# Patient Record
Sex: Female | Born: 1970
Health system: Southern US, Community
[De-identification: ages and names within clinical notes are randomized; demographics above are authoritative.]

## PROBLEM LIST (undated history)

## (undated) DIAGNOSIS — D571 Sickle-cell disease without crisis: Secondary | ICD-10-CM

## (undated) DIAGNOSIS — T7840XA Allergy, unspecified, initial encounter: Secondary | ICD-10-CM

## (undated) DIAGNOSIS — E785 Hyperlipidemia, unspecified: Secondary | ICD-10-CM

## (undated) DIAGNOSIS — I10 Essential (primary) hypertension: Secondary | ICD-10-CM

## (undated) HISTORY — DX: Sickle-cell disease without crisis: D57.1

## (undated) HISTORY — DX: Hyperlipidemia, unspecified: E78.5

## (undated) HISTORY — DX: Allergy, unspecified, initial encounter: T78.40XA

---

## 2010-02-08 ENCOUNTER — Inpatient Hospital Stay (HOSPITAL_COMMUNITY)
Admission: AD | Admit: 2010-02-08 | Discharge: 2010-02-08 | Payer: Self-pay | Source: Home / Self Care | Attending: Obstetrics & Gynecology | Admitting: Obstetrics & Gynecology

## 2010-02-08 ENCOUNTER — Inpatient Hospital Stay (HOSPITAL_COMMUNITY)
Admission: AD | Admit: 2010-02-08 | Discharge: 2010-02-08 | Disposition: A | Discharge status: Still In Hospital | Payer: Self-pay | Source: Home / Self Care | Admitting: Obstetrics & Gynecology

## 2010-02-22 ENCOUNTER — Encounter: Payer: Self-pay | Admitting: Physician Assistant

## 2010-02-23 ENCOUNTER — Inpatient Hospital Stay (HOSPITAL_COMMUNITY)
Admission: AD | Admit: 2010-02-23 | Discharge: 2010-02-23 | Disposition: A | Discharge status: Home / Self Care | Payer: Self-pay | Source: Ambulatory Visit | Attending: Obstetrics and Gynecology | Admitting: Obstetrics and Gynecology

## 2010-02-23 DIAGNOSIS — I10 Essential (primary) hypertension: Secondary | ICD-10-CM

## 2010-02-23 DIAGNOSIS — N751 Abscess of Bartholin's gland: Secondary | ICD-10-CM

## 2010-02-26 LAB — POCT PREGNANCY, URINE: Preg Test, Ur: NEGATIVE

## 2010-03-01 ENCOUNTER — Encounter: Payer: Self-pay | Admitting: Family Medicine

## 2010-09-26 ENCOUNTER — Other Ambulatory Visit: Payer: Self-pay | Admitting: Family Medicine

## 2010-09-26 ENCOUNTER — Other Ambulatory Visit: Payer: Self-pay | Admitting: Obstetrics & Gynecology

## 2010-09-26 DIAGNOSIS — Z1231 Encounter for screening mammogram for malignant neoplasm of breast: Secondary | ICD-10-CM

## 2010-10-10 ENCOUNTER — Ambulatory Visit (HOSPITAL_COMMUNITY)
Admission: RE | Admit: 2010-10-10 | Discharge: 2010-10-10 | Disposition: A | Discharge status: Home / Self Care | Payer: No Typology Code available for payment source | Source: Ambulatory Visit | Attending: Obstetrics & Gynecology | Admitting: Obstetrics & Gynecology

## 2010-10-10 DIAGNOSIS — Z1231 Encounter for screening mammogram for malignant neoplasm of breast: Secondary | ICD-10-CM

## 2010-10-19 ENCOUNTER — Emergency Department (HOSPITAL_COMMUNITY): Payer: No Typology Code available for payment source

## 2010-10-19 ENCOUNTER — Emergency Department (HOSPITAL_COMMUNITY)
Admission: EM | Admit: 2010-10-19 | Discharge: 2010-10-19 | Disposition: A | Discharge status: Home / Self Care | Payer: No Typology Code available for payment source | Attending: Emergency Medicine | Admitting: Emergency Medicine

## 2010-10-19 DIAGNOSIS — Y92410 Unspecified street and highway as the place of occurrence of the external cause: Secondary | ICD-10-CM | POA: Insufficient documentation

## 2010-10-19 DIAGNOSIS — S7010XA Contusion of unspecified thigh, initial encounter: Secondary | ICD-10-CM | POA: Insufficient documentation

## 2010-10-19 DIAGNOSIS — I10 Essential (primary) hypertension: Secondary | ICD-10-CM | POA: Insufficient documentation

## 2010-10-19 DIAGNOSIS — M549 Dorsalgia, unspecified: Secondary | ICD-10-CM | POA: Insufficient documentation

## 2010-10-19 DIAGNOSIS — T1490XA Injury, unspecified, initial encounter: Secondary | ICD-10-CM | POA: Insufficient documentation

## 2010-10-19 DIAGNOSIS — M542 Cervicalgia: Secondary | ICD-10-CM | POA: Insufficient documentation

## 2011-12-04 ENCOUNTER — Other Ambulatory Visit (HOSPITAL_COMMUNITY): Payer: Self-pay | Admitting: Physician Assistant

## 2011-12-04 DIAGNOSIS — Z1231 Encounter for screening mammogram for malignant neoplasm of breast: Secondary | ICD-10-CM

## 2011-12-24 ENCOUNTER — Ambulatory Visit (HOSPITAL_COMMUNITY)
Admission: RE | Admit: 2011-12-24 | Discharge: 2011-12-24 | Disposition: A | Discharge status: Home / Self Care | Payer: Self-pay | Source: Ambulatory Visit | Attending: Physician Assistant | Admitting: Physician Assistant

## 2011-12-24 DIAGNOSIS — Z1231 Encounter for screening mammogram for malignant neoplasm of breast: Secondary | ICD-10-CM

## 2014-08-11 ENCOUNTER — Other Ambulatory Visit (HOSPITAL_COMMUNITY): Payer: Self-pay | Admitting: Nurse Practitioner

## 2014-08-11 DIAGNOSIS — Z1231 Encounter for screening mammogram for malignant neoplasm of breast: Secondary | ICD-10-CM

## 2014-08-17 ENCOUNTER — Ambulatory Visit (HOSPITAL_COMMUNITY)
Admission: RE | Admit: 2014-08-17 | Discharge: 2014-08-17 | Disposition: A | Discharge status: Home / Self Care | Payer: BLUE CROSS/BLUE SHIELD | Source: Ambulatory Visit | Attending: Nurse Practitioner | Admitting: Nurse Practitioner

## 2014-08-17 DIAGNOSIS — Z1231 Encounter for screening mammogram for malignant neoplasm of breast: Secondary | ICD-10-CM | POA: Diagnosis not present

## 2014-12-14 ENCOUNTER — Encounter (HOSPITAL_COMMUNITY): Payer: Self-pay | Admitting: Emergency Medicine

## 2014-12-14 ENCOUNTER — Emergency Department (HOSPITAL_COMMUNITY)
Admission: EM | Admit: 2014-12-14 | Discharge: 2014-12-14 | Disposition: A | Discharge status: Home / Self Care | Payer: BLUE CROSS/BLUE SHIELD | Attending: Emergency Medicine | Admitting: Emergency Medicine

## 2014-12-14 DIAGNOSIS — Z79899 Other long term (current) drug therapy: Secondary | ICD-10-CM | POA: Diagnosis not present

## 2014-12-14 DIAGNOSIS — Z87891 Personal history of nicotine dependence: Secondary | ICD-10-CM | POA: Diagnosis not present

## 2014-12-14 DIAGNOSIS — X58XXXA Exposure to other specified factors, initial encounter: Secondary | ICD-10-CM | POA: Diagnosis not present

## 2014-12-14 DIAGNOSIS — Y9289 Other specified places as the place of occurrence of the external cause: Secondary | ICD-10-CM | POA: Diagnosis not present

## 2014-12-14 DIAGNOSIS — Y9389 Activity, other specified: Secondary | ICD-10-CM | POA: Insufficient documentation

## 2014-12-14 DIAGNOSIS — T783XXA Angioneurotic edema, initial encounter: Secondary | ICD-10-CM

## 2014-12-14 DIAGNOSIS — I10 Essential (primary) hypertension: Secondary | ICD-10-CM | POA: Insufficient documentation

## 2014-12-14 DIAGNOSIS — Y998 Other external cause status: Secondary | ICD-10-CM | POA: Insufficient documentation

## 2014-12-14 HISTORY — DX: Essential (primary) hypertension: I10

## 2014-12-14 MED ORDER — METHYLPREDNISOLONE SODIUM SUCC 125 MG IJ SOLR: 125.0000 mg | Freq: Once | INTRAMUSCULAR | Status: DC

## 2014-12-14 MED ORDER — PREDNISONE 20 MG PO TABS
60.0000 mg | ORAL_TABLET | Freq: Once | ORAL | Status: AC
Start: 2014-12-14 — End: 2014-12-14
  Administered 2014-12-14: 60 mg via ORAL
  Filled 2014-12-14: qty 3

## 2014-12-14 MED ORDER — SODIUM CHLORIDE 0.9 % IV SOLN
10.0000 mg | Freq: Once | INTRAVENOUS | Status: AC
  Administered 2014-12-14: 10 mg via INTRAVENOUS
  Filled 2014-12-14: qty 1

## 2014-12-14 MED ORDER — HYDROCHLOROTHIAZIDE 50 MG PO TABS: 50.0000 mg | ORAL_TABLET | Freq: Every day | ORAL | Status: DC

## 2014-12-14 MED ORDER — DIPHENHYDRAMINE HCL 50 MG/ML IJ SOLN
25.0000 mg | Freq: Once | INTRAMUSCULAR | Status: AC
  Administered 2014-12-14: 25 mg via INTRAVENOUS
  Filled 2014-12-14: qty 1

## 2014-12-14 NOTE — ED Provider Notes (Signed)
CSN: [id number]     Arrival date & time 12/14/14  3244 History   First MD Initiated Contact with Patient 12/14/14 0703     Chief Complaint  Patient presents with  . Allergic Reaction   Sally Rivera is a 44 y.o. female with a history of hypertension who presents to the emergency department complaining of upper lip swelling since yesterday. Patient reports she is having some allergy symptoms and took a Claritin yesterday. She reports around 3 PM yesterday she noticed some upper lip swelling. She reports taking some Benadryl last night without relief. Patient reports she's been on lisinopril-hydrochlorothiazide for the past several years. She reports she's had no trouble with this medicine. She reports she has not taken her medicine today. Patient denies history of angioedema previously. The patient denies tongue swelling, throat swelling, trouble breathing, fevers, chills, shortness of breath, chest pain, coughing, wheezing, abdominal pain, nausea, vomiting or rashes.   (Consider location/radiation/quality/duration/timing/severity/associated sxs/prior Treatment) HPI  Past Medical History  Diagnosis Date  . Hypertension    History reviewed. No pertinent past surgical history. No family history on file. Social History  Substance Use Topics  . Smoking status: Former Smoker    Quit date: 12/13/2012  . Smokeless tobacco: None  . Alcohol Use: None   OB History    No data available     Review of Systems  Constitutional: Negative for fever and chills.  HENT: Positive for facial swelling. Negative for congestion, mouth sores, sore throat and trouble swallowing.   Eyes: Negative for visual disturbance.  Respiratory: Negative for cough, chest tightness, shortness of breath and wheezing.   Cardiovascular: Negative for chest pain.  Gastrointestinal: Negative for nausea, vomiting, abdominal pain and diarrhea.  Genitourinary: Negative for dysuria.  Musculoskeletal: Negative for back pain and  neck pain.  Skin: Negative for rash and wound.  Neurological: Negative for syncope, light-headedness and headaches.      Allergies  Review of patient's allergies indicates no known allergies.  Home Medications   Prior to Admission medications   Medication Sig Start Date End Date Taking? Authorizing Provider  Cholecalciferol (VITAMIN D PO) Take 1 tablet by mouth daily.   Yes Historical Provider, MD  cyclobenzaprine (FLEXERIL) 10 MG tablet Take 10 mg by mouth 3 (three) times daily as needed for muscle spasms.   Yes Historical Provider, MD  diphenhydrAMINE (BENADRYL) 12.5 MG/5ML liquid Take 9 mg by mouth 4 (four) times daily as needed for allergies.   Yes Historical Provider, MD  loratadine (CLARITIN) 10 MG tablet Take 10 mg by mouth once.   Yes Historical Provider, MD  Probiotic Product (PROBIOTIC PO) Take 1 tablet by mouth daily.   Yes Historical Provider, MD  hydrochlorothiazide (HYDRODIURIL) 50 MG tablet Take 1 tablet (50 mg total) by mouth daily. 12/14/14   Everlene Farrier, PA-C   BP 137/101 mmHg  Pulse 75  Temp(Src) 98 F (36.7 C) (Oral)  Resp 18  SpO2 97% Physical Exam  Constitutional: She is oriented to person, place, and time. She appears well-developed and well-nourished. No distress.  Nontoxic appearing.  HENT:  Head: Normocephalic and atraumatic.  Right Ear: External ear normal.  Left Ear: External ear normal.  Mouth/Throat: Oropharynx is clear and moist.  Angioedema noted to the patient's upper lip. No tongue swelling noted. No posterior oropharyngeal edema. Uvula is midline without edema. Handling her secretions without difficulty. Bilateral tympanic membranes are pearly-gray without erythema or loss of landmarks.   Eyes: Conjunctivae are normal. Pupils are equal, round,  and reactive to light. Right eye exhibits no discharge. Left eye exhibits no discharge.  Neck: Normal range of motion. Neck supple. No tracheal deviation present.  Cardiovascular: Normal rate, regular  rhythm, normal heart sounds and intact distal pulses.  Exam reveals no gallop and no friction rub.   No murmur heard. Pulmonary/Chest: Effort normal and breath sounds normal. No respiratory distress. She has no wheezes. She has no rales.  Lungs clear to auscultation bilaterally.  Abdominal: Soft. There is no tenderness. There is no guarding.  Musculoskeletal: She exhibits no edema or tenderness.  No lower extremity edema or tenderness.  Lymphadenopathy:    She has no cervical adenopathy.  Neurological: She is alert and oriented to person, place, and time. Coordination normal.  Skin: Skin is warm and dry. No rash noted. She is not diaphoretic. No erythema. No pallor.  No hives or rashes.  Psychiatric: She has a normal mood and affect. Her behavior is normal.  Nursing note and vitals reviewed.   ED Course  Procedures (including critical care time) Labs Review Labs Reviewed - No data to display  Imaging Review No results found. I have personally reviewed and evaluated these images and lab results as part of my medical decision-making.   EKG Interpretation None      Filed Vitals:   12/14/14 0845 12/14/14 0915 12/14/14 0930 12/14/14 1000  BP: 151/102  149/86 137/101  Pulse: 69 66 73 75  Temp:      TempSrc:      Resp:      SpO2: 100% 100% 100% 97%     MDM   Meds given in ED:  Medications  diphenhydrAMINE (BENADRYL) injection 25 mg (25 mg Intravenous Given 12/14/14 0817)  famotidine (PEPCID) 10 mg in sodium chloride 0.9 % 25 mL (0 mg Intravenous Stopped 12/14/14 0903)  predniSONE (DELTASONE) tablet 60 mg (60 mg Oral Given 12/14/14 0816)    New Prescriptions   HYDROCHLOROTHIAZIDE (HYDRODIURIL) 50 MG TABLET    Take 1 tablet (50 mg total) by mouth daily.    Final diagnoses:  Angioedema of lips, initial encounter   This s a 44 y.o. female with a history of hypertension who presents to the emergency department complaining of upper lip swelling since yesterday. Patient  reports she is having some allergy symptoms and took a Claritin yesterday. She reports around 3 PM yesterday she noticed some upper lip swelling. She reports taking some Benadryl last night without relief. Patient reports she's been on lisinopril-hydrochlorothiazide for the past several years. She reports she's had no trouble with this medicine. She reports she has not taken her medicine today. The patient denies tongue swelling, throat swelling, trouble breathing, trouble swallowing or shortness of breath. On exam the patient is afebrile nontoxic appearing. The patient has angioedema of her upper lip. This is localized. There is no evidence of tongue swelling. No evidence of posterior oropharyngeal edema. No drooling. Patient is handling secretions without difficulty. Her lungs are clear to auscultation bilaterally.  We'll provide the patient with prednisone, Pepcid and Benadryl and observe in the ED. At several re-evaluations the patient denies any tongue swelling or trouble breathing. A final reevaluation patient's angioedema appears to be slightly improved. I believe this is from her lisinopril. I advised to stop taking lisinopril. Will place her on hydrochlorothiazide 50 mg until she can follow-up with her primary care provider. I advised very strict return precautions including to return if she has tongue or throat swelling or trouble breathing. I advised the  patient to follow-up with their primary care provider this week. I advised the patient to return to the emergency department with new or worsening symptoms or new concerns. The patient verbalized understanding and agreement with plan.     This patient was discussed with Dr. Rosalia Hammersay who agrees with assessment and plan.     Everlene FarrierWilliam Issis Lindseth, PA-C 12/14/14 1048  Margarita Grizzleanielle Ray, MD 12/14/14 [phone]

## 2014-12-14 NOTE — Discharge Instructions (Signed)
I believe her angioedema today was caused by the medication lisinopril. Please start taking lisinopril. Please take Hydrocort Dyazide 50 mg once a day until you're able to follow-up with her primary care provider to have new blood pressure medicines.   Angioedema Angioedema is a sudden swelling of tissues, often of the skin. It can occur on the face or genitals or in the abdomen or other body parts. The swelling usually develops over a short period and gets better in 24 to 48 hours. It often begins during the night and is found when the person wakes up. The person may also get red, itchy patches of skin (hives). Angioedema can be dangerous if it involves swelling of the air passages.  Depending on the cause, episodes of angioedema may only happen once, come back in unpredictable patterns, or repeat for several years and then gradually fade away.  CAUSES  Angioedema can be caused by an allergic reaction to various triggers. It can also result from nonallergic causes, including reactions to drugs, immune system disorders, viral infections, or an abnormal gene that is passed to you from your parents (hereditary). For some people with angioedema, the cause is unknown.  Some things that can trigger angioedema include:   Foods.   Medicines, such as ACE inhibitors, ARBs, nonsteroidal anti-inflammatory agents, or estrogen.   Latex.   Animal saliva.   Insect stings.   Dyes used in X-rays.   Mild injury.   Dental work.  Surgery.  Stress.   Sudden changes in temperature.   Exercise. SIGNS AND SYMPTOMS   Swelling of the skin.  Hives. If these are present, there is also intense itching.  Redness in the affected area.   Pain in the affected area.  Swollen lips or tongue.  Breathing problems. This may happen if the air passages swell.  Wheezing. If internal organs are involved, there may be:   Nausea.   Abdominal pain.   Vomiting.   Difficulty swallowing.    Difficulty passing urine. DIAGNOSIS   Your health care provider will examine the affected area and take a medical and family history.  Various tests may be done to help determine the cause. Tests may include:  Allergy skin tests to see if the problem is an allergic reaction.   Blood tests to check for hereditary angioedema.   Tests to check for underlying diseases that could cause the condition.   A review of your medicines, including over-the-counter medicines, may be done. TREATMENT  Treatment will depend on the cause of the angioedema. Possible treatments include:   Removal of anything that triggered the condition (such as stopping certain medicines).   Medicines to treat symptoms or prevent attacks. Medicines given may include:   Antihistamines.   Epinephrine injection.   Steroids.   Hospitalization may be required for severe attacks. If the air passages are affected, it can be an emergency. Tubes may need to be placed to keep the airway open. HOME CARE INSTRUCTIONS   Take all medicines as directed by your health care provider.  If you were given medicines for emergency allergy treatment, always carry them with you.  Wear a medical bracelet as directed by your health care provider.   Avoid known triggers. SEEK MEDICAL CARE IF:   You have repeat attacks of angioedema.   Your attacks are more frequent or more severe despite preventive measures.   You have hereditary angioedema and are considering having children. It is important to discuss with your health care provider the  risks of passing the condition on to your children. SEEK IMMEDIATE MEDICAL CARE IF:   You have severe swelling of the mouth, tongue, or lips.  You have difficulty breathing.   You have difficulty swallowing.   You faint. MAKE SURE YOU:  Understand these instructions.  Will watch your condition.  Will get help right away if you are not doing well or get worse.   This  information is not intended to replace advice given to you by your health care provider. Make sure you discuss any questions you have with your health care provider.   Document Released: 03/18/2001 Document Revised: 01/28/2014 Document Reviewed: 08/31/2012 Elsevier Interactive Patient Education [employer]2016 [employer]vier Inc.

## 2014-12-14 NOTE — ED Notes (Signed)
Swelling noted to upper lip no respiratory distress noted at this time.

## 2014-12-14 NOTE — ED Notes (Signed)
Patient reports that she had taken Claritin approx 1130 yesterday, and approx 1400 patient sent home from work with upper lip swelling. At approx 2100, patient took Children's Benadryl 9 mg. During the night, lip swollen and facial edema noted. Patient denies shortness of breath or respiratory distress. Denies pain, but reports soreness of jaw.

## 2016-02-06 DIAGNOSIS — I10 Essential (primary) hypertension: Secondary | ICD-10-CM | POA: Diagnosis not present

## 2016-04-16 ENCOUNTER — Encounter: Payer: Self-pay | Admitting: Family Medicine

## 2016-04-18 ENCOUNTER — Ambulatory Visit (INDEPENDENT_AMBULATORY_CARE_PROVIDER_SITE_OTHER): Payer: BLUE CROSS/BLUE SHIELD | Admitting: Physician Assistant

## 2016-04-18 VITALS — BP 144/93 | HR 81 | Temp 98.2°F | Resp 16 | Ht 63.5 in | Wt 195.0 lb

## 2016-04-18 DIAGNOSIS — L509 Urticaria, unspecified: Secondary | ICD-10-CM | POA: Diagnosis not present

## 2016-04-18 DIAGNOSIS — I10 Essential (primary) hypertension: Secondary | ICD-10-CM | POA: Diagnosis not present

## 2016-04-18 MED ORDER — BD ASSURE BPM/MANUAL ARM CUFF MISC: 1.0000 | Freq: Every day | 0 refills | Status: AC

## 2016-04-18 NOTE — Progress Notes (Signed)
Sally Rivera  MRN: [id number] DOB: April 09, 1970  PCP: Elizabeth Sauer  Chief Complaint  Patient presents with  . Medication Refill    losartan, amlodipine, its itching in random places and whelping up since she stated the Losartan     Subjective:  Pt presents to clinic for BP medication refill and discussion of an intermittent rash that is hive like and itches bad - it comes and goes throughout the day.  She thought the rash was related to menopause but it has not changed.  She thought it was environmental so she changed her lotions and detergents but that did not help. Now that she has stopped all those the rash has not changed - she thinks it might be related to norvasc - she thinks it started about the same time as the medication was intitiated.  She started losartan in Jan and has not had f/u since then She start Norvasc - 12/16.  Home BP check - no does not have a cuff  She has changed her detergent to ALL and she she is using Dove soap and shea butter for moisturizing.  Review of Systems  Constitutional: Negative for chills and fever.  Respiratory: Negative for shortness of breath.   Cardiovascular: Negative for chest pain, palpitations and leg swelling.  Neurological: Negative for headaches.    Patient Active Problem List   Diagnosis Date Noted  . HTN (hypertension) 04/18/2016    No current outpatient prescriptions on file prior to visit.   No current facility-administered medications on file prior to visit.     Allergies  Allergen Reactions  . Lisinopril Swelling  . Soma [Carisoprodol] Swelling    Pt patients past, family and social history were reviewed and updated.   Objective:  BP (!) 144/93 (BP Location: Right Arm, Patient Position: Sitting, Cuff Size: Small)   Pulse 81   Temp 98.2 F (36.8 C) (Oral)   Resp 16   Ht 5' 3.5" (1.613 m)   Wt 195 lb (88.5 kg)   LMP 04/11/2016   SpO2 97%   BMI 34.00 kg/m   Physical Exam  Constitutional: She is  oriented to person, place, and time and well-developed, well-nourished, and in no distress.  HENT:  Head: Normocephalic and atraumatic.  Right Ear: Hearing and external ear normal.  Left Ear: Hearing and external ear normal.  Eyes: Conjunctivae are normal.  Neck: Normal range of motion.  Cardiovascular: Normal rate, regular rhythm and normal heart sounds.   No murmur heard. Pulmonary/Chest: Effort normal and breath sounds normal.  Musculoskeletal:       Right lower leg: She exhibits no edema.       Left lower leg: She exhibits no edema.  Neurological: She is alert and oriented to person, place, and time. Gait normal.  Skin: Skin is warm and dry.  Few urticarial lesions  Psychiatric: Mood, memory, affect and judgment normal.  Vitals reviewed.   Assessment and Plan :  Hives  Essential hypertension - Plan: CMP14+EGFR, TSH, Lipid panel, Blood Pressure Monitoring (B-D ASSURE BPM/MANUAL ARM CUFF) MISC -- for now we will stop the norvasc as she thinks that is the case - we will increase her losartan - we will do this for the next 2 weeks and we will monitor to see if the rash changes -- we will f/u with me in a month.   She can use zantac and zyrtec to help with the itching.  Windell Hummingbird PA-C  [facility] 04/20/2016 9:54 AM

## 2016-04-18 NOTE — Patient Instructions (Signed)
For the itching Any OTC antihistamine - zytrec  Or allergra or claritin  Zantac 150 2x/day   For now stop the norvasc for the next 2 weeks and increase your losartan to 2 pills a day - if the itching stops we will continue this - please check your BP during this time - if the itching gets worse we will switch and increase the norvasc and stop the losartan.    We recommend that you schedule a mammogram for breast cancer screening. Typically, you do not need a referral to do this. Please contact a local imaging center to schedule your mammogram.  [facility] - [phone](336) [phone]  *ask for the Radiology Department [facility] - [phone](336) [phone] or [phone]) [phone]  [facility] - [phone]) [phone] [facility] - [phone](336) [phone] [facility] - [phone]) [phone]  *ask for the Radiology Department [facility] - [phone]) [phone]  *ask for the Radiology Department [facility] - [phone]) [phone]  *ask for the Mammography Department [facility] - [phone](336) [phone]

## 2016-04-19 LAB — CMP14+EGFR
A/G RATIO: 1.7 (ref 1.2–2.2)
ALT: 15 IU/L (ref 0–32)
AST: 17 IU/L (ref 0–40)
Albumin: 4.4 g/dL (ref 3.5–5.5)
Alkaline Phosphatase: 69 IU/L (ref 39–117)
BILIRUBIN TOTAL: 0.3 mg/dL (ref 0.0–1.2)
BUN/Creatinine Ratio: 15 (ref 9–23)
BUN: 11 mg/dL (ref 6–24)
CHLORIDE: 99 mmol/L (ref 96–106)
CO2: 25 mmol/L (ref 18–29)
Calcium: 9.7 mg/dL (ref 8.7–10.2)
Creatinine, Ser: 0.71 mg/dL (ref 0.57–1.00)
GFR calc non Af Amer: 102 mL/min/{1.73_m2} (ref >59)
GFR, EST AFRICAN AMERICAN: 118 mL/min/{1.73_m2} (ref >59)
GLOBULIN, TOTAL: 2.6 g/dL (ref 1.5–4.5)
Glucose: 89 mg/dL (ref 65–99)
POTASSIUM: 3.6 mmol/L (ref 3.5–5.2)
SODIUM: 139 mmol/L (ref 134–144)
Total Protein: 7 g/dL (ref 6.0–8.5)

## 2016-04-19 LAB — LIPID PANEL
CHOL/HDL RATIO: 4.2 ratio (ref 0.0–4.4)
CHOLESTEROL TOTAL: 193 mg/dL (ref 100–199)
HDL: 46 mg/dL (ref >39)
LDL Calculated: 129 mg/dL — ABNORMAL HIGH (ref 0–99)
TRIGLYCERIDES: 88 mg/dL (ref 0–149)
VLDL Cholesterol Cal: 18 mg/dL (ref 5–40)

## 2016-04-19 LAB — TSH: TSH: 1.16 u[IU]/mL (ref 0.450–4.500)

## 2016-04-30 ENCOUNTER — Encounter (HOSPITAL_COMMUNITY): Payer: Self-pay | Admitting: Emergency Medicine

## 2016-05-03 ENCOUNTER — Telehealth: Payer: Self-pay | Admitting: Family Medicine

## 2016-05-03 NOTE — Telephone Encounter (Signed)
Sally Rivera PATIENT STATES THAT THE PHARMACIST WANT YOU TO SEND RX FOR LOSARTAN TO [facility] ON [address] AND SHE ALSO NEED A RX FOR A CREAM FOR HER ITCH THE OTC CREAM DOESN'T WORK FOR HER PLEASE CALL PT FOR MORE INFORMATION

## 2016-05-03 NOTE — Telephone Encounter (Signed)
Please offer advice for cont hives/rash cream. Re check ov?

## 2016-05-04 NOTE — Telephone Encounter (Signed)
Please contact pt - what dose of losartan is she taking?  Has stopping the norvasc helped - I assume no since she is asking for cream?  I think if she is still having rash an OV would be best to decide the next step.

## 2016-05-07 ENCOUNTER — Encounter: Payer: Self-pay | Admitting: Physician Assistant

## 2016-05-07 ENCOUNTER — Telehealth: Payer: Self-pay | Admitting: Physician Assistant

## 2016-05-07 NOTE — Telephone Encounter (Signed)
Pt is needing a refill on losartin and her hydro cordizone cream 2.5 %  She states that since she has stopped seeing the person that gave this to her originally she needs sarah to write new rx's to get a refill   best number [phone]

## 2016-05-08 ENCOUNTER — Other Ambulatory Visit: Payer: Self-pay | Admitting: Emergency Medicine

## 2016-05-08 MED ORDER — LOSARTAN POTASSIUM-HCTZ 50-12.5 MG PO TABS: 1.0000 | ORAL_TABLET | Freq: Every day | ORAL | 0 refills | Status: DC

## 2016-05-08 NOTE — Telephone Encounter (Signed)
Sally Rivera, I confirmed with patient she is taking Losartan/HCTZ 50/12.5 mg daily and she stopped taking Amlodopine due to muscle aches as of yesterday.  I refilled medication and -escribed to pharmacy

## 2016-05-13 NOTE — Progress Notes (Unsigned)
Needs new rx for 100/25 losartan/hctz Or did you want 25/12.5 bid?

## 2016-05-14 MED ORDER — LOSARTAN POTASSIUM-HCTZ 100-25 MG PO TABS: 1.0000 | ORAL_TABLET | Freq: Every day | ORAL | 0 refills | Status: DC

## 2016-05-14 NOTE — Progress Notes (Signed)
Left message medication was sent to pharmacy to take once a day instead of BID Advised to call office and schedule 1 week f/u appt to recheck BP

## 2016-05-14 NOTE — Progress Notes (Unsigned)
I have sent in the higher dose at once a day.  Please let the patient know that as we discussed at her last OV I would like to see her in a week or so for a recheck.

## 2016-05-16 MED ORDER — HYDROCORTISONE 2.5 % EX CREA: TOPICAL_CREAM | Freq: Two times a day (BID) | CUTANEOUS | 0 refills | Status: DC

## 2016-05-16 NOTE — Telephone Encounter (Signed)
Rash is better, but not gone. She would like the cream to help it resolve.  NOT taking the amlodipine. When she stopped it, the muscle pain stopped.  She has been taking 2 of the losartan-HCTZ 50-12.5, and will be picking up the 100-25 Rx today.  She will follow-up with Ms. Weber in 2 weeks.  Meds ordered this encounter  Medications  . hydrocortisone 2.5 % cream    Sig: Apply topically 2 (two) times daily.    Dispense:  30 g    Refill:  0    Order Specific Question:   Supervising Provider    Answer:   Clelia Croft, EVA N [[id number]]

## 2016-05-31 ENCOUNTER — Other Ambulatory Visit: Payer: Self-pay | Admitting: Physician Assistant

## 2016-06-04 NOTE — Telephone Encounter (Signed)
If pt needs to continue this medication she needs an ov.

## 2016-06-06 ENCOUNTER — Ambulatory Visit: Payer: Self-pay | Admitting: Physician Assistant

## 2016-07-18 ENCOUNTER — Ambulatory Visit (INDEPENDENT_AMBULATORY_CARE_PROVIDER_SITE_OTHER): Payer: BLUE CROSS/BLUE SHIELD | Admitting: Physician Assistant

## 2016-07-18 ENCOUNTER — Encounter: Payer: Self-pay | Admitting: Physician Assistant

## 2016-07-18 VITALS — BP 115/79 | HR 87 | Temp 98.1°F | Resp 18 | Ht 63.5 in | Wt 215.0 lb

## 2016-07-18 DIAGNOSIS — J0100 Acute maxillary sinusitis, unspecified: Secondary | ICD-10-CM | POA: Diagnosis not present

## 2016-07-18 DIAGNOSIS — J029 Acute pharyngitis, unspecified: Secondary | ICD-10-CM | POA: Diagnosis not present

## 2016-07-18 DIAGNOSIS — L299 Pruritus, unspecified: Secondary | ICD-10-CM

## 2016-07-18 MED ORDER — AZELASTINE HCL 0.1 % NA SOLN: 2.0000 | Freq: Two times a day (BID) | NASAL | 0 refills | Status: DC

## 2016-07-18 MED ORDER — IBUPROFEN 800 MG PO TABS: 800.0000 mg | ORAL_TABLET | Freq: Three times a day (TID) | ORAL | 0 refills | Status: DC | PRN

## 2016-07-18 MED ORDER — AMOXICILLIN-POT CLAVULANATE 875-125 MG PO TABS: 1.0000 | ORAL_TABLET | Freq: Two times a day (BID) | ORAL | 0 refills | Status: AC

## 2016-07-18 MED ORDER — BENZONATATE 100 MG PO CAPS: 100.0000 mg | ORAL_CAPSULE | Freq: Three times a day (TID) | ORAL | 0 refills | Status: DC | PRN

## 2016-07-18 MED ORDER — HYDROCORTISONE 2.5 % EX CREA: TOPICAL_CREAM | Freq: Two times a day (BID) | CUTANEOUS | 0 refills | Status: DC

## 2016-07-18 NOTE — Patient Instructions (Signed)
     IF you received an x-ray today, you will receive an invoice from [facility]. Please contact [facility] at [phone] with questions or concerns regarding your invoice.   IF you received labwork today, you will receive an invoice from [facility]. Please contact [facility] at [phone] with questions or concerns regarding your invoice.   Our billing staff will not be able to assist you with questions regarding bills from these companies.  You will be contacted with the lab results as soon as they are available. The fastest way to get your results is to activate your My Chart account. Instructions are located on the last page of this paperwork. If you have not heard from us regarding the results in 2 weeks, please contact this office.     

## 2016-07-18 NOTE — Progress Notes (Signed)
Patient ID: Sally Rivera, female    DOB: [birth year], 46 y.o.   MRN: [id number]  PCP: Morrell Riddle, PA-C  Chief Complaint  Patient presents with  . Chest congestion    x2 weeks, per pt sore throat, drainage, cough in the morning and at night, fatigue.  . Medication Refill    Hydrocortisone cream    Subjective:   Presents for evaluation of 2 weeks of congestion and cough.  Illness began as sore throat. She has post-nasal drainage and fatigue. Subjective fever, overall achiness.  No nausea, vomiting, diarrhea. 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  Also requests a refill of the steroid cream she uses periodically when she has itching. This issue began when she was starting amlodipine and then losartan for HTN control.   Review of Systems As above.    Patient Active Problem List   Diagnosis Date Noted  . HTN (hypertension) 04/18/2016     Prior to Admission medications   Medication Sig Start Date End Date Taking? Authorizing Provider  Blood Pressure Monitoring (B-D ASSURE BPM/MANUAL ARM CUFF) MISC 1 Device by Does not apply route daily. 04/18/16  Yes Weber, Dema Severin, PA-C  Cholecalciferol (VITAMIN D PO) Take 1 tablet by mouth daily.   Yes [provider]  hydrocortisone 2.5 % cream Apply topically 2 (two) times daily. 07/18/16  Yes Kaytlan Behrman, PA-C  losartan-hydrochlorothiazide (HYZAAR) 100-25 MG tablet Take 1 tablet by mouth daily. 05/14/16  Yes Weber, Dema Severin, PA-C  Probiotic Product (PROBIOTIC PO) Take 1 tablet by mouth daily.   Yes [provider]  losartan-hydrochlorothiazide (HYZAAR) 50-12.5 MG tablet TK 1 T PO D 05/08/16   [provider]     Allergies  Allergen Reactions  . Lisinopril Swelling  . Soma [Carisoprodol] Swelling  . Amlodipine Other (See Comments)    Muscle pain       Objective:  Physical Exam  Constitutional: She is oriented to person,  place, and time. She appears well-developed and well-nourished. No distress.  BP 115/79 (BP Location: Right Arm, Patient Position: Sitting, Cuff Size: Large)   Pulse 87   Temp 98.1 F (36.7 C) (Oral)   Resp 18   Ht 5' 3.5" (1.613 m)   Wt 215 lb (97.5 kg)   SpO2 98%   BMI 37.49 kg/m    HENT:  Head: Normocephalic and atraumatic.  Right Ear: Hearing, tympanic membrane, external ear and ear canal normal.  Left Ear: Hearing, tympanic membrane, external ear and ear canal normal.  Nose: Mucosal edema and rhinorrhea present.  No foreign bodies. Right sinus exhibits maxillary sinus tenderness (exquisite) and frontal sinus tenderness (mild). Left sinus exhibits maxillary sinus tenderness (exquisite) and frontal sinus tenderness (mild).  Mouth/Throat: Uvula is midline, oropharynx is clear and moist and mucous membranes are normal. No uvula swelling. No oropharyngeal exudate.  Eyes: Conjunctivae and EOM are normal. Pupils are equal, round, and reactive to light. Right eye exhibits no discharge. Left eye exhibits no discharge. No scleral icterus.  Neck: Trachea normal, normal range of motion and full passive range of motion without pain. Neck supple. No thyroid mass and no thyromegaly present.  Cardiovascular: Normal rate, regular rhythm and normal heart sounds.   Pulmonary/Chest: Effort normal and breath sounds normal.  Lymphadenopathy:       Head (right side): No submandibular, no tonsillar, no preauricular, no posterior auricular and no occipital adenopathy present.  Head (left side): No submandibular, no tonsillar, no preauricular and no occipital adenopathy present.    She has no cervical adenopathy.       Right: No supraclavicular adenopathy present.       Left: No supraclavicular adenopathy present.  Neurological: She is alert and oriented to person, place, and time. She has normal strength. No cranial nerve deficit or sensory deficit.  Skin: Skin is warm, dry and intact. No rash noted.    Psychiatric: She has a normal mood and affect. Her speech is normal and behavior is normal.           Assessment & Plan:   1. Subacute maxillary sinusitis 2. Sore throat Supportive care.  Anticipatory guidance.  RTC if symptoms worsen/persist. - amoxicillin-clavulanate (AUGMENTIN) 875-125 MG tablet; Take 1 tablet by mouth 2 (two) times daily.  Dispense: 20 tablet; Refill: 0 - azelastine (ASTELIN) 0.1 % nasal spray; Place 2 sprays into both nostrils 2 (two) times daily. Use in each nostril as directed  Dispense: 30 mL; Refill: 0 - benzonatate (TESSALON) 100 MG capsule; Take 1-2 capsules (100-200 mg total) by mouth 3 (three) times daily as needed for cough.  Dispense: 40 capsule; Refill: 0 - ibuprofen (ADVIL,MOTRIN) 800 MG tablet; Take 1 tablet (800 mg total) by mouth every 8 (eight) hours as needed.  Dispense: 30 tablet; Refill: 0   3. Pruritus - hydrocortisone 2.5 % cream; Apply topically 2 (two) times daily.  Dispense: 30 g; Refill: 0    Return if symptoms worsen or fail to improve.   Fernande Brashelle S. Shephanie Romas, PA-C Primary Care at [facility]

## 2016-07-18 NOTE — Progress Notes (Signed)
Subjective:    Patient ID: Sally Rivera, female    DOB: February 15, 1970, 46 y.o.   MRN: [id number] Morrell Riddle, PA-C   HPI Patient presenting with sore throat, congestion, and postnasal drip x2 weeks. She endorses pleuritic chest pain and a productive cough as well, primarily at night - sputum appears thick and yellowish-gray. She tried taking Coricidin cold and flu without relief. She also reports muscle aches, for which she has been taking Motrin with relief.  She has a history of allergies but denies itching or watery eyes. Denies nausea or vomiting.  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 This is her second day of missing work.   Patient Active Problem List   Diagnosis Date Noted  . HTN (hypertension) 04/18/2016   Prior to Admission medications   Medication Sig Start Date End Date Taking? Authorizing Provider  Blood Pressure Monitoring (B-D ASSURE BPM/MANUAL ARM CUFF) MISC 1 Device by Does not apply route daily. 04/18/16  Yes Weber, Dema Severin, PA-C  Cholecalciferol (VITAMIN D PO) Take 1 tablet by mouth daily.   Yes [provider]  diphenhydrAMINE (BENADRYL) 12.5 MG/5ML liquid Take 9 mg by mouth 4 (four) times daily as needed for allergies.   Yes [provider]  hydrocortisone 2.5 % cream Apply topically 2 (two) times daily. 05/16/16  Yes Jeffery, Chelle, PA-C  losartan-hydrochlorothiazide (HYZAAR) 100-25 MG tablet Take 1 tablet by mouth daily. 05/14/16  Yes Weber, Dema Severin, PA-C  Probiotic Product (PROBIOTIC PO) Take 1 tablet by mouth daily.   Yes [provider]  cyclobenzaprine (FLEXERIL) 10 MG tablet Take 10 mg by mouth 3 (three) times daily as needed for muscle spasms.    [provider]  loratadine (CLARITIN) 10 MG tablet Take 10 mg by mouth once.    [provider]    Allergies  Allergen Reactions  . Lisinopril Swelling  . Soma [Carisoprodol] Swelling  . Amlodipine Other (See  Comments)    Muscle pain    Review of Systems See above.       Objective:   Physical Exam  Constitutional: She appears well-developed and well-nourished.  HENT:  Head: Normocephalic and atraumatic.  Right Ear: Hearing, tympanic membrane, external ear and ear canal normal.  Left Ear: Hearing, tympanic membrane, external ear and ear canal normal.  Nose: Mucosal edema present. Right sinus exhibits maxillary sinus tenderness. Left sinus exhibits maxillary sinus tenderness.  Mouth/Throat: Uvula is midline, oropharynx is clear and moist and mucous membranes are normal. No oropharyngeal exudate, posterior oropharyngeal edema or posterior oropharyngeal erythema.  Eyes: EOM are normal.  Neck: Normal range of motion. Neck supple.  Cardiovascular: Normal rate, regular rhythm, S1 normal, S2 normal, normal heart sounds and intact distal pulses.   Pulmonary/Chest: Effort normal and breath sounds normal. She has no wheezes. She has no rales.  Lymphadenopathy:    She has no cervical adenopathy.  Neurological: She is alert.  Skin: Skin is warm and dry. No rash noted.  Psychiatric: She has a normal mood and affect. Her behavior is normal.        Assessment & Plan:   1. Subacute maxillary sinusitis 2. Sore throat Rx Augmentin 875-125 mg oral x10 days. Supportive care given as well. Advised calling if symptoms fail to improve. Follow up as needed. - amoxicillin-clavulanate (AUGMENTIN) 875-125 MG tablet; Take 1 tablet by mouth 2 (two) times daily.  Dispense: 20 tablet; Refill: 0 - azelastine (ASTELIN) 0.1 %  nasal spray; Place 2 sprays into both nostrils 2 (two) times daily. Use in each nostril as directed  Dispense: 30 mL; Refill: 0 - benzonatate (TESSALON) 100 MG capsule; Take 1-2 capsules (100-200 mg total) by mouth 3 (three) times daily as needed for cough.  Dispense: 40 capsule; Refill: 0 - ibuprofen (ADVIL,MOTRIN) 800 MG tablet; Take 1 tablet (800 mg total) by mouth every 8 (eight) hours as  needed.  Dispense: 30 tablet; Refill: 0   3. Pruritus Refill given. Well controlled on current regiment. Instructed to discontinue hydrocortisone if she finds herself needing 7 days of consecutive use. - hydrocortisone 2.5 % cream; Apply topically 2 (two) times daily.  Dispense: 30 g; Refill: 0

## 2016-07-27 ENCOUNTER — Other Ambulatory Visit: Payer: Self-pay | Admitting: Physician Assistant

## 2016-08-09 ENCOUNTER — Other Ambulatory Visit: Payer: Self-pay | Admitting: Physician Assistant

## 2016-08-09 NOTE — Telephone Encounter (Signed)
Pt called req. losartan-hydrochlorothiazide (HYZAAR) 50 MG tablet [[id number]][[id number]] 90 day supply  [facility] [address]

## 2016-08-21 ENCOUNTER — Other Ambulatory Visit: Payer: Self-pay | Admitting: Physician Assistant

## 2016-09-02 ENCOUNTER — Other Ambulatory Visit: Payer: Self-pay | Admitting: Physician Assistant

## 2016-09-25 ENCOUNTER — Other Ambulatory Visit: Payer: Self-pay | Admitting: Physician Assistant

## 2016-09-25 ENCOUNTER — Telehealth: Payer: Self-pay | Admitting: Physician Assistant

## 2016-09-25 DIAGNOSIS — L299 Pruritus, unspecified: Secondary | ICD-10-CM

## 2016-09-25 DIAGNOSIS — J0100 Acute maxillary sinusitis, unspecified: Secondary | ICD-10-CM

## 2016-09-25 MED ORDER — LOSARTAN POTASSIUM-HCTZ 100-25 MG PO TABS: ORAL_TABLET | ORAL | 0 refills | Status: DC

## 2016-09-25 NOTE — Telephone Encounter (Signed)
This is always ok to do.  I have done but wanted to let you all know this.

## 2016-09-25 NOTE — Telephone Encounter (Signed)
Pt called to see Weber today for a med refill but unfortunately she is not here today.  She is taking her last pill today and I scheduled her an appt with Weber on 9/11 at 10:40 for a med refill.  Could she get a temporary refill of her Losartan until her scheduled appt?  [facility] [address]  [phone]

## 2016-10-01 ENCOUNTER — Ambulatory Visit: Payer: BLUE CROSS/BLUE SHIELD | Admitting: Physician Assistant

## 2016-10-03 ENCOUNTER — Other Ambulatory Visit: Payer: Self-pay

## 2016-10-03 DIAGNOSIS — J0100 Acute maxillary sinusitis, unspecified: Secondary | ICD-10-CM

## 2016-10-03 MED ORDER — IBUPROFEN 800 MG PO TABS: 800.0000 mg | ORAL_TABLET | Freq: Three times a day (TID) | ORAL | 0 refills | Status: DC | PRN

## 2016-10-05 MED ORDER — HYDROCORTISONE 2.5 % EX CREA: TOPICAL_CREAM | Freq: Two times a day (BID) | CUTANEOUS | 0 refills | Status: DC

## 2016-10-15 ENCOUNTER — Encounter: Payer: Self-pay | Admitting: Physician Assistant

## 2016-10-15 ENCOUNTER — Ambulatory Visit (INDEPENDENT_AMBULATORY_CARE_PROVIDER_SITE_OTHER): Payer: BLUE CROSS/BLUE SHIELD | Admitting: Physician Assistant

## 2016-10-15 VITALS — BP 144/100 | HR 101 | Temp 98.3°F | Resp 18 | Ht 63.5 in | Wt 237.8 lb

## 2016-10-15 DIAGNOSIS — M791 Myalgia, unspecified site: Secondary | ICD-10-CM

## 2016-10-15 DIAGNOSIS — R635 Abnormal weight gain: Secondary | ICD-10-CM

## 2016-10-15 DIAGNOSIS — E7800 Pure hypercholesterolemia, unspecified: Secondary | ICD-10-CM | POA: Diagnosis not present

## 2016-10-15 DIAGNOSIS — E669 Obesity, unspecified: Secondary | ICD-10-CM | POA: Insufficient documentation

## 2016-10-15 DIAGNOSIS — E66812 Obesity, class 2: Secondary | ICD-10-CM | POA: Insufficient documentation

## 2016-10-15 DIAGNOSIS — I10 Essential (primary) hypertension: Secondary | ICD-10-CM

## 2016-10-15 MED ORDER — CYCLOBENZAPRINE HCL 5 MG PO TABS: 5.0000 mg | ORAL_TABLET | Freq: Three times a day (TID) | ORAL | 1 refills | Status: DC | PRN

## 2016-10-15 MED ORDER — LOSARTAN POTASSIUM-HCTZ 100-25 MG PO TABS: ORAL_TABLET | ORAL | 0 refills | Status: DC

## 2016-10-15 NOTE — Progress Notes (Signed)
Sally Rivera  MRN: [id number] DOB: 12-May-1970  PCP: Mancel Bale, PA-C  Chief Complaint  Patient presents with  . Medication Refill    losartan and another medication that was prescribed by another provider   . Weight Check    weight gain     Subjective:  Pt presents to clinic for medication refill. She has been taking her BP at home and it has been normal.  She ran out of medication and has not taken it this am.  She feels good when she is on the medication.  She has stopped noticing the itch spots as much - it only happens less than once a week and it does not seem to be related to anything - she does know it is not related to her medications as she once suspected.    She stopped ETOH in 2/18, in Feb stopped smoking and recreational marijuana - she has gained weight since then and she is having muscle tightness esp in herback and she has used some flexeril and it is helping a lot with the pain and she would like some more.  She is currently not exercising.    Breakfast - oatmeal ([place] - 200 calories) Lunch - lara bar Dinner - lean meat with veggies Snacking - mainly at night - cupcakes and ice cream - large portions later at night after supper  History is obtained by patient.  Review of Systems  Constitutional: Negative for chills and fever.  Eyes: Negative for visual disturbance.  Respiratory: Negative for shortness of breath.   Cardiovascular: Negative for chest pain, palpitations and leg swelling.  Neurological: Negative for dizziness, light-headedness and headaches.    Patient Active Problem List   Diagnosis Date Noted  . Obesity, Class III, BMI 40-49.9 (morbid obesity) ([place]) 10/15/2016  . HTN (hypertension) 04/18/2016    Current Outpatient Prescriptions on File Prior to Visit  Medication Sig Dispense Refill  . Blood Pressure Monitoring (B-D ASSURE BPM/MANUAL ARM CUFF) MISC 1 Device by Does not apply route daily. 1 each 0  . hydrocortisone 2.5 % cream  Apply topically 2 (two) times daily. 30 g 0  . ibuprofen (ADVIL,MOTRIN) 800 MG tablet Take 1 tablet (800 mg total) by mouth every 8 (eight) hours as needed. 30 tablet 0  . Probiotic Product (PROBIOTIC PO) Take 1 tablet by mouth daily.    . [DISCONTINUED] lisinopril-hydrochlorothiazide (PRINZIDE,ZESTORETIC) 20-25 MG tablet Take 1 tablet by mouth daily.  0   No current facility-administered medications on file prior to visit.     Allergies  Allergen Reactions  . Lisinopril Swelling  . Soma [Carisoprodol] Swelling  . Amlodipine Other (See Comments)    Muscle pain    Past Medical History:  Diagnosis Date  . Allergy   . Hypertension   . Sickle cell anemia (HCC)    carries the traIT   Social History   Social History Narrative   ** Merged History Encounter **       Social History  Substance Use Topics  . Smoking status: Never Smoker  . Smokeless tobacco: Never Used  . Alcohol use No   family history is not on file.     Objective:  BP (!) 144/100   Pulse (!) 101   Temp 98.3 F (36.8 C) (Oral)   Resp 18   Ht 5' 3.5" (1.613 m)   Wt 237 lb 12.8 oz (107.9 kg)   LMP 09/11/2016   SpO2 96%   BMI 41.46 kg/m  Body  mass index is 41.46 kg/m.  Physical Exam  Constitutional: She is oriented to person, place, and time and well-developed, well-nourished, and in no distress.  HENT:  Head: Normocephalic and atraumatic.  Right Ear: Hearing and external ear normal.  Left Ear: Hearing and external ear normal.  Eyes: Conjunctivae are normal.  Neck: Normal range of motion.  Cardiovascular: Normal rate, regular rhythm and normal heart sounds.   No murmur heard. Pulmonary/Chest: Effort normal and breath sounds normal.  Musculoskeletal:       Right lower leg: She exhibits no edema.       Left lower leg: She exhibits no edema.  Neurological: She is alert and oriented to person, place, and time. Gait normal.  Skin: Skin is warm and dry.  Psychiatric: Mood, memory, affect and  judgment normal.  Vitals reviewed.   Wt Readings from Last 3 Encounters:  10/15/16 237 lb 12.8 oz (107.9 kg)  07/18/16 215 lb (97.5 kg)  04/18/16 195 lb (88.5 kg)    Assessment and Plan :  Myalgia - Plan: cyclobenzaprine (FLEXERIL) 5 MG tablet - will refill for prn use -  Weight gain - discussed with she likely needs more protein and calories during the day to curb nighttime hunger - we talked about health snacking late night options.  Essential hypertension - Plan: losartan-hydrochlorothiazide (HYZAAR) 100-25 MG tablet, CMP14+EGFR - restart medication s- her BP is not well controlled today but she has not had her medications today = she will continue to monitor at home and we will recheck in 6 weeks.  Elevated cholesterol - Plan: Lipid panel - check labs to look at how her diet changes over the last 6 months have affected this  Obesity, Class III, BMI 40-49.9 (morbid obesity) ([place]) - d/w pt that phentermine (which she asked for today) is not a good option due to HTN - she was encouraged to track her calories and make healthy options during the day.  Windell Hummingbird PA-C  Primary Care at [facility] 10/15/2016 11:25 AM

## 2016-10-15 NOTE — Patient Instructions (Addendum)
     IF you received an x-ray today, you will receive an invoice from [facility]. Please contact [facility] at [phone] with questions or concerns regarding your invoice.   IF you received labwork today, you will receive an invoice from [facility]. Please contact [facility] at [phone] with questions or concerns regarding your invoice.   Our billing staff will not be able to assist you with questions regarding bills from these companies.  You will be contacted with the lab results as soon as they are available. The fastest way to get your results is to activate your My Chart account. Instructions are located on the last page of this paperwork. If you have not heard from [place] regarding the results in 2 weeks, please contact this office.    My fitness PAL   App 1500 calories a day 90-100 g of protein

## 2016-10-16 LAB — CMP14+EGFR
A/G RATIO: 1.4 (ref 1.2–2.2)
ALBUMIN: 4.3 g/dL (ref 3.5–5.5)
ALK PHOS: 110 IU/L (ref 39–117)
ALT: 19 IU/L (ref 0–32)
AST: 17 IU/L (ref 0–40)
BILIRUBIN TOTAL: 0.3 mg/dL (ref 0.0–1.2)
BUN / CREAT RATIO: 15 (ref 9–23)
BUN: 12 mg/dL (ref 6–24)
CHLORIDE: 99 mmol/L (ref 96–106)
CO2: 24 mmol/L (ref 20–29)
Calcium: 10.2 mg/dL (ref 8.7–10.2)
Creatinine, Ser: 0.79 mg/dL (ref 0.57–1.00)
GFR calc non Af Amer: 89 mL/min/{1.73_m2} (ref >59)
GFR, EST AFRICAN AMERICAN: 103 mL/min/{1.73_m2} (ref >59)
GLUCOSE: 101 mg/dL — AB (ref 65–99)
Globulin, Total: 3.1 g/dL (ref 1.5–4.5)
POTASSIUM: 4.4 mmol/L (ref 3.5–5.2)
Sodium: 139 mmol/L (ref 134–144)
TOTAL PROTEIN: 7.4 g/dL (ref 6.0–8.5)

## 2016-10-16 LAB — LIPID PANEL
CHOL/HDL RATIO: 5 ratio — AB (ref 0.0–4.4)
Cholesterol, Total: 184 mg/dL (ref 100–199)
HDL: 37 mg/dL — AB (ref >39)
LDL Calculated: 118 mg/dL — ABNORMAL HIGH (ref 0–99)
TRIGLYCERIDES: 147 mg/dL (ref 0–149)
VLDL CHOLESTEROL CAL: 29 mg/dL (ref 5–40)

## 2016-10-23 ENCOUNTER — Other Ambulatory Visit: Payer: Self-pay | Admitting: Physician Assistant

## 2016-10-23 DIAGNOSIS — J0100 Acute maxillary sinusitis, unspecified: Secondary | ICD-10-CM

## 2016-11-04 ENCOUNTER — Encounter: Payer: Self-pay | Admitting: Physician Assistant

## 2016-11-04 DIAGNOSIS — I10 Essential (primary) hypertension: Secondary | ICD-10-CM

## 2016-11-05 ENCOUNTER — Encounter: Payer: Self-pay | Admitting: Physician Assistant

## 2016-11-05 MED ORDER — LOSARTAN POTASSIUM-HCTZ 100-25 MG PO TABS: ORAL_TABLET | ORAL | 0 refills | Status: DC

## 2016-11-14 ENCOUNTER — Other Ambulatory Visit: Payer: Self-pay | Admitting: Physician Assistant

## 2016-11-14 DIAGNOSIS — J0100 Acute maxillary sinusitis, unspecified: Secondary | ICD-10-CM

## 2016-12-20 ENCOUNTER — Other Ambulatory Visit: Payer: Self-pay | Admitting: Physician Assistant

## 2016-12-20 DIAGNOSIS — M791 Myalgia, unspecified site: Secondary | ICD-10-CM

## 2017-01-01 ENCOUNTER — Other Ambulatory Visit: Payer: Self-pay | Admitting: Physician Assistant

## 2017-01-01 DIAGNOSIS — M791 Myalgia, unspecified site: Secondary | ICD-10-CM

## 2017-01-01 NOTE — Telephone Encounter (Signed)
Flexeril refill. Last OV and refill on  10/15/16.

## 2017-01-02 ENCOUNTER — Other Ambulatory Visit: Payer: Self-pay | Admitting: Physician Assistant

## 2017-01-02 DIAGNOSIS — J0100 Acute maxillary sinusitis, unspecified: Secondary | ICD-10-CM

## 2017-01-10 ENCOUNTER — Other Ambulatory Visit: Payer: Self-pay | Admitting: Physician Assistant

## 2017-01-10 DIAGNOSIS — L299 Pruritus, unspecified: Secondary | ICD-10-CM

## 2017-01-17 ENCOUNTER — Other Ambulatory Visit: Payer: Self-pay | Admitting: Physician Assistant

## 2017-01-17 DIAGNOSIS — L299 Pruritus, unspecified: Secondary | ICD-10-CM

## 2017-01-17 DIAGNOSIS — J0100 Acute maxillary sinusitis, unspecified: Secondary | ICD-10-CM

## 2017-01-19 NOTE — Telephone Encounter (Signed)
Patient notified via My Chart.  Meds ordered this encounter  Medications  . ibuprofen (ADVIL,MOTRIN) 800 MG tablet    Sig: TAKE 1 TABLET(800 MG) BY MOUTH EVERY 8 HOURS AS NEEDED    Dispense:  30 tablet    Refill:  0

## 2017-01-22 ENCOUNTER — Other Ambulatory Visit: Payer: Self-pay

## 2017-01-22 DIAGNOSIS — J0100 Acute maxillary sinusitis, unspecified: Secondary | ICD-10-CM

## 2017-01-22 MED ORDER — IBUPROFEN 800 MG PO TABS: ORAL_TABLET | ORAL | 0 refills | Status: DC

## 2017-03-13 ENCOUNTER — Other Ambulatory Visit: Payer: Self-pay | Admitting: Physician Assistant

## 2017-03-13 DIAGNOSIS — I10 Essential (primary) hypertension: Secondary | ICD-10-CM

## 2017-05-17 ENCOUNTER — Ambulatory Visit: Payer: BLUE CROSS/BLUE SHIELD | Admitting: Physician Assistant

## 2017-05-26 ENCOUNTER — Other Ambulatory Visit: Payer: Self-pay | Admitting: Physician Assistant

## 2017-05-26 DIAGNOSIS — L299 Pruritus, unspecified: Secondary | ICD-10-CM

## 2017-06-13 ENCOUNTER — Encounter: Payer: Self-pay | Admitting: Physician Assistant

## 2017-06-13 ENCOUNTER — Other Ambulatory Visit: Payer: Self-pay

## 2017-06-13 ENCOUNTER — Other Ambulatory Visit: Payer: Self-pay | Admitting: Physician Assistant

## 2017-06-13 ENCOUNTER — Ambulatory Visit: Payer: BLUE CROSS/BLUE SHIELD | Admitting: Physician Assistant

## 2017-06-13 VITALS — BP 118/70 | HR 89 | Temp 97.8°F | Resp 18 | Ht 63.5 in | Wt 208.6 lb

## 2017-06-13 DIAGNOSIS — L299 Pruritus, unspecified: Secondary | ICD-10-CM | POA: Diagnosis not present

## 2017-06-13 DIAGNOSIS — R12 Heartburn: Secondary | ICD-10-CM

## 2017-06-13 DIAGNOSIS — Z23 Encounter for immunization: Secondary | ICD-10-CM

## 2017-06-13 DIAGNOSIS — I10 Essential (primary) hypertension: Secondary | ICD-10-CM | POA: Diagnosis not present

## 2017-06-13 DIAGNOSIS — Z01419 Encounter for gynecological examination (general) (routine) without abnormal findings: Secondary | ICD-10-CM | POA: Diagnosis not present

## 2017-06-13 DIAGNOSIS — E7800 Pure hypercholesterolemia, unspecified: Secondary | ICD-10-CM

## 2017-06-13 LAB — CMP14+EGFR
ALBUMIN: 4.2 g/dL (ref 3.5–5.5)
ALK PHOS: 100 IU/L (ref 39–117)
ALT: 13 IU/L (ref 0–32)
AST: 13 IU/L (ref 0–40)
Albumin/Globulin Ratio: 1.5 (ref 1.2–2.2)
BUN/Creatinine Ratio: 13 (ref 9–23)
BUN: 11 mg/dL (ref 6–24)
Bilirubin Total: 0.5 mg/dL (ref 0.0–1.2)
CALCIUM: 9.7 mg/dL (ref 8.7–10.2)
CO2: 23 mmol/L (ref 20–29)
CREATININE: 0.88 mg/dL (ref 0.57–1.00)
Chloride: 95 mmol/L — ABNORMAL LOW (ref 96–106)
GFR calc Af Amer: 90 mL/min/{1.73_m2} (ref >59)
GFR, EST NON AFRICAN AMERICAN: 78 mL/min/{1.73_m2} (ref >59)
GLUCOSE: 98 mg/dL (ref 65–99)
Globulin, Total: 2.8 g/dL (ref 1.5–4.5)
Potassium: 3.5 mmol/L (ref 3.5–5.2)
Sodium: 136 mmol/L (ref 134–144)
Total Protein: 7 g/dL (ref 6.0–8.5)

## 2017-06-13 LAB — URINALYSIS, DIPSTICK ONLY
BILIRUBIN UA: NEGATIVE
Glucose, UA: NEGATIVE
Ketones, UA: NEGATIVE
NITRITE UA: NEGATIVE
PH UA: 5.5 (ref 5.0–7.5)
Protein, UA: NEGATIVE
RBC, UA: NEGATIVE
SPEC GRAV UA: 1.019 (ref 1.005–1.030)
Urobilinogen, Ur: 0.2 mg/dL (ref 0.2–1.0)

## 2017-06-13 LAB — LIPID PANEL
CHOLESTEROL TOTAL: 192 mg/dL (ref 100–199)
Chol/HDL Ratio: 4.8 ratio — ABNORMAL HIGH (ref 0.0–4.4)
HDL: 40 mg/dL (ref >39)
LDL CALC: 129 mg/dL — AB (ref 0–99)
Triglycerides: 115 mg/dL (ref 0–149)
VLDL CHOLESTEROL CAL: 23 mg/dL (ref 5–40)

## 2017-06-13 MED ORDER — CHLORTHALIDONE 25 MG PO TABS: 25.0000 mg | ORAL_TABLET | Freq: Every day | ORAL | 1 refills | Status: DC

## 2017-06-13 MED ORDER — HYDROCORTISONE 2.5 % EX CREA: TOPICAL_CREAM | Freq: Two times a day (BID) | CUTANEOUS | 0 refills | Status: DC

## 2017-06-13 MED ORDER — RANITIDINE HCL 150 MG PO TABS
150.0000 mg | ORAL_TABLET | Freq: Two times a day (BID) | ORAL | 0 refills | Status: DC
Start: 2017-06-13 — End: 2017-06-15

## 2017-06-13 MED ORDER — MEASLES, MUMPS & RUBELLA VAC ~~LOC~~ INJ: 0.5000 mL | INJECTION | Freq: Once | SUBCUTANEOUS | 0 refills | Status: AC

## 2017-06-13 MED ORDER — METOPROLOL SUCCINATE ER 50 MG PO TB24: 50.0000 mg | ORAL_TABLET | Freq: Every day | ORAL | 1 refills | Status: DC

## 2017-06-13 NOTE — Progress Notes (Signed)
Sally Rivera  MRN: [id number] DOB: [birth year]  PCP: Mancel Bale, PA-C   Chief Complaint  Patient presents with  . Gynecologic Exam    needs pap  . Hypertension    wants to change BP meds     Subjective:  Pt presents to clinic for a gyn exam and just a pap smear - it was at least 4 years ago but it was normal - she had an abnormal with cryotherapy when she was 18 and her pap smears have been normal since then.  She is having no vaginal symptoms.  She has a spot on her left leg - she had it and it went away and then her husband hot something similar in the same area and it went away and now she has another one.  It does not hurt.  She just wants to make sure it is ok.  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  She would like to get it at work.  She has heartburn - uses her husbands priloec and it helps - she has found it is certain medications that makes her heartburn worse  She would like to change her BP medications - she is currently on Hyzaar and with all the recalls on the losartan she no longer wants to be on that class of medication.  She has tried lisinopril and norvasc in the past with side effects.  She has been working on her diet and being healthy - she has lost weight  Last pap: 3-4 years ago Last mammo: last year - normal  Patient Active Problem List   Diagnosis Date Noted  . Obesity, Class II, BMI 35-39.9 10/15/2016  . HTN (hypertension) 04/18/2016    Patient Care Team: Mittie Bodo as PCP - General (Physician Assistant)  Review of Systems  Constitutional: Negative for chills and fever.  Eyes: Negative for visual disturbance.  Respiratory: Negative for shortness of breath.   Cardiovascular: Negative for chest pain, palpitations and leg swelling.  Gastrointestinal:       Intermittent heartburn  Genitourinary: Negative.  Negative for menstrual problem (none in a year).   Starting to have hot flashes  Neurological: Negative for dizziness, light-headedness and headaches.     Current Outpatient Medications on File Prior to Visit  Medication Sig Dispense Refill  . Blood Pressure Monitoring (B-D ASSURE BPM/MANUAL ARM CUFF) MISC 1 Device by Does not apply route daily. 1 each 0  . cyclobenzaprine (FLEXERIL) 5 MG tablet TAKE 1 TABLET(5 MG) BY MOUTH THREE TIMES DAILY AS NEEDED FOR MUSCLE SPASMS 60 tablet 0  . Probiotic Product (PROBIOTIC PO) Take 1 tablet by mouth daily.    . [DISCONTINUED] lisinopril-hydrochlorothiazide (PRINZIDE,ZESTORETIC) 20-25 MG tablet Take 1 tablet by mouth daily.  0   No current facility-administered medications on file prior to visit.     Allergies  Allergen Reactions  . Lisinopril Swelling  . Soma [Carisoprodol] Swelling  . Amlodipine Other (See Comments)    Muscle pain    Social History   Socioeconomic History  . Marital status: Married    Spouse name: Not on file  . Number of children: Not on file  . Years of education: Not on file  . Highest education level: Not on file  Occupational History  . Not on file  Social Needs  . Financial resource strain: Not on file  . Food insecurity:    Worry:  Not on file    Inability: Not on file  . Transportation needs:    Medical: Not on file    Non-medical: Not on file  Tobacco Use  . Smoking status: Former Research scientist (life sciences)  . Smokeless tobacco: Never Used  Substance and Sexual Activity  . Alcohol use: No  . Drug use: No  . Sexual activity: Not on file  Lifestyle  . Physical activity:    Days per week: Not on file    Minutes per session: Not on file  . Stress: Not on file  Relationships  . Social connections:    Talks on phone: Not on file    Gets together: Not on file    Attends religious service: Not on file    Active member of club or organization: Not on file    Attends meetings of clubs or organizations: Not on file    Relationship status: Not on file  Other Topics Concern    . Not on file  Social History Narrative   ** Merged History Encounter **        No past surgical history on file.  No family history on file.   Objective:  BP 118/70   Pulse 89   Temp 97.8 F (36.6 C) (Oral)   Resp 18   Ht 5' 3.5" (1.613 m)   Wt 208 lb 9.6 oz (94.6 kg)   SpO2 97%   BMI 36.37 kg/m   Physical Exam  Constitutional: She is oriented to person, place, and time. She appears well-developed and well-nourished.  HENT:  Head: Normocephalic and atraumatic.  Right Ear: Hearing and external ear normal.  Left Ear: Hearing and external ear normal.  Eyes: Conjunctivae are normal.  Neck: Normal range of motion.  Cardiovascular: Normal rate, regular rhythm and normal heart sounds.  No murmur heard. Pulmonary/Chest: Effort normal and breath sounds normal.  Genitourinary: Uterus normal. Pelvic exam was performed with patient supine. There is no rash, tenderness, lesion or injury on the right labia. There is no rash, tenderness, lesion or injury on the left labia. Cervix exhibits no motion tenderness, no discharge and no friability. Right adnexum displays no mass, no tenderness and no fullness. Left adnexum displays no mass, no tenderness and no fullness. No erythema, tenderness or bleeding in the vagina. No foreign body in the vagina. No signs of injury around the vagina. Vaginal discharge (thin white discharge without odor - per patient normal for her) found.  Musculoskeletal:       Right lower leg: She exhibits no edema.       Left lower leg: She exhibits no edema.  Neurological: She is alert and oriented to person, place, and time.  Skin: Skin is warm and dry.  Psychiatric: She has a normal mood and affect. Her behavior is normal. Judgment and thought content normal.  Vitals reviewed.   Wt Readings from Last 3 Encounters:  06/13/17 208 lb 9.6 oz (94.6 kg)  10/15/16 237 lb 12.8 oz (107.9 kg)  07/18/16 215 lb (97.5 kg)    Assessment and Plan :  Essential hypertension  - Plan: chlorthalidone (HYGROTON) 25 MG tablet, metoprolol succinate (TOPROL-XL) 50 MG 24 hr tablet, CMP14+EGFR, Urinalysis, dipstick only - pt wants to completely change her BP medications - we will switch to chlorthalidone since it is longer acting and add metoprolol - d/w pt side effects and when to take her medications  Pruritus - Plan: hydrocortisone 2.5 % cream  Need for MMR vaccine - Plan: measles, mumps  and rubella vaccine (MMR) injection  Heartburn - Plan: ranitidine (ZANTAC) 150 MG tablet - use as needed either after symptoms start or with foods that she knows causes her symptoms  Elevated cholesterol - Plan: Lipid panel - check labs see what her weight has done for her cholesterol  Encounter for gynecological examination without abnormal finding - Plan: Pap IG and HPV (high risk) DNA detection - check labs  Windell Hummingbird PA-C  Primary Care at [place] 06/13/2017 9:25 AM

## 2017-06-13 NOTE — Patient Instructions (Addendum)
We have changed you to to completely different blood pressure medications.  Please take 1 in the morning and 1 in the evening for better 24-hour coverage.  Please continue to monitor your blood pressure at home.  I will see you back in a month to determine how the new medications are controlling her blood pressure  Gave you Zantac to help with her as needed heartburn.  Either take after meals when you experience symptoms or before meals that you know could potentially cause symptoms.  I will contact you with your lab results as soon as they are available.   If you have not heard from me in 2 weeks, please contact me.  The fastest way to get your results is to register for My Chart (see the instructions on this printout).    IF you received an x-ray today, you will receive an invoice from [facility]. Please contact [facility] at [phone] with questions or concerns regarding your invoice.   IF you received labwork today, you will receive an invoice from [facility]. Please contact [facility] at [phone] with questions or concerns regarding your invoice.   Our billing staff will not be able to assist you with questions regarding bills from these companies.  You will be contacted with the lab results as soon as they are available. The fastest way to get your results is to activate your My Chart account. Instructions are located on the last page of this paperwork. If you have not heard from [place] regarding the results in 2 weeks, please contact this office.

## 2017-06-15 ENCOUNTER — Other Ambulatory Visit: Payer: Self-pay | Admitting: Physician Assistant

## 2017-06-15 DIAGNOSIS — R12 Heartburn: Secondary | ICD-10-CM

## 2017-06-18 LAB — PAP IG AND HPV HIGH-RISK
HPV, high-risk: NEGATIVE
PAP SMEAR COMMENT: 0

## 2017-06-21 ENCOUNTER — Other Ambulatory Visit: Payer: Self-pay | Admitting: Physician Assistant

## 2017-06-21 DIAGNOSIS — J0100 Acute maxillary sinusitis, unspecified: Secondary | ICD-10-CM

## 2017-07-04 ENCOUNTER — Other Ambulatory Visit: Payer: Self-pay | Admitting: Physician Assistant

## 2017-07-04 DIAGNOSIS — J0100 Acute maxillary sinusitis, unspecified: Secondary | ICD-10-CM

## 2017-07-14 ENCOUNTER — Ambulatory Visit: Payer: BLUE CROSS/BLUE SHIELD | Admitting: Physician Assistant

## 2017-07-15 ENCOUNTER — Other Ambulatory Visit: Payer: Self-pay | Admitting: Physician Assistant

## 2017-07-15 DIAGNOSIS — M791 Myalgia, unspecified site: Secondary | ICD-10-CM

## 2017-07-15 NOTE — Telephone Encounter (Signed)
Patient is requesting a refill of the following medications: Requested Prescriptions   Pending Prescriptions Disp Refills  . cyclobenzaprine (FLEXERIL) 5 MG tablet [Pharmacy Med Name: CYCLOBENZAPRINE 5MG  TABLETS] 60 tablet 0    Sig: TAKE 1 TABLET BY MOUTH THREE TIMES DAILY AS NEEDED FOR MUSCLE SPASMS. NEED OFFICE VISIT    Date of patient request: 07/15/2017 Last office visit: 06/13/2017 Date of last refill: 01/02/2018 Last refill amount: 60 Follow up time period per chart:

## 2017-07-18 ENCOUNTER — Encounter: Payer: Self-pay | Admitting: Physician Assistant

## 2017-08-11 ENCOUNTER — Other Ambulatory Visit: Payer: Self-pay | Admitting: Physician Assistant

## 2017-08-11 DIAGNOSIS — I10 Essential (primary) hypertension: Secondary | ICD-10-CM

## 2017-08-13 NOTE — Telephone Encounter (Signed)
metoprolol refill Last Refill:06/13/17 # 30 1 RF Last OV: 06/13/17 PCP: Benny LennertSarah Weber PA Pharmacy:[facility] [phone]  chlorthalidone refill Last Refill:06/13/17 # 30 1 RF Last OV: 06/13/17   Called and left message on patients voice mail to make an appt to have BP checked

## 2017-09-10 ENCOUNTER — Other Ambulatory Visit: Payer: Self-pay | Admitting: Physician Assistant

## 2017-09-10 DIAGNOSIS — I10 Essential (primary) hypertension: Secondary | ICD-10-CM

## 2017-09-19 ENCOUNTER — Other Ambulatory Visit: Payer: Self-pay | Admitting: Physician Assistant

## 2017-09-19 DIAGNOSIS — R12 Heartburn: Secondary | ICD-10-CM

## 2017-09-23 ENCOUNTER — Other Ambulatory Visit: Payer: Self-pay

## 2017-09-23 ENCOUNTER — Encounter: Payer: Self-pay | Admitting: Physician Assistant

## 2017-09-23 ENCOUNTER — Ambulatory Visit: Payer: BLUE CROSS/BLUE SHIELD | Admitting: Physician Assistant

## 2017-09-23 VITALS — BP 108/78 | HR 75 | Temp 98.8°F | Resp 18 | Ht 63.5 in | Wt 210.8 lb

## 2017-09-23 DIAGNOSIS — M791 Myalgia, unspecified site: Secondary | ICD-10-CM | POA: Diagnosis not present

## 2017-09-23 DIAGNOSIS — E669 Obesity, unspecified: Secondary | ICD-10-CM | POA: Diagnosis not present

## 2017-09-23 DIAGNOSIS — I10 Essential (primary) hypertension: Secondary | ICD-10-CM

## 2017-09-23 MED ORDER — CYCLOBENZAPRINE HCL 5 MG PO TABS: ORAL_TABLET | ORAL | 1 refills | Status: DC

## 2017-09-23 MED ORDER — METOPROLOL SUCCINATE ER 50 MG PO TB24: ORAL_TABLET | ORAL | 4 refills | Status: DC

## 2017-09-23 MED ORDER — CHLORTHALIDONE 25 MG PO TABS: ORAL_TABLET | ORAL | 4 refills | Status: DC

## 2017-09-23 NOTE — Progress Notes (Signed)
Sally Rivera  MRN: [id number] DOB: 10/17/1970  PCP: Morrell Riddle, PA-C  Chief Complaint  Patient presents with  . Hypertension    follow up     Subjective:  Pt presents to clinic for medications refill.  She feels great on this medication combination.  Home BP readings are the same as today.  She has been losing weight over the last year, she is reached a plateau.  History is obtained by patient.  Review of Systems  Constitutional: Negative for chills and fever.  Eyes: Negative for visual disturbance.  Respiratory: Negative for shortness of breath.   Cardiovascular: Negative for chest pain, palpitations and leg swelling.  Neurological: Negative for dizziness, light-headedness and headaches.    Patient Active Problem List   Diagnosis Date Noted  . Obesity, Class II, BMI 35-39.9 10/15/2016  . HTN (hypertension) 04/18/2016    Current Outpatient Medications on File Prior to Visit  Medication Sig Dispense Refill  . Blood Pressure Monitoring (B-D ASSURE BPM/MANUAL ARM CUFF) MISC 1 Device by Does not apply route daily. 1 each 0  . hydrocortisone 2.5 % cream Apply topically 2 (two) times daily. 30 g 0  . Probiotic Product (PROBIOTIC PO) Take 1 tablet by mouth daily.    . ranitidine (ZANTAC) 300 MG tablet TAKE 1/2 TABLET BY MOUTH TWICE DAILY 90 tablet 0  . [DISCONTINUED] lisinopril-hydrochlorothiazide (PRINZIDE,ZESTORETIC) 20-25 MG tablet Take 1 tablet by mouth daily.  0   No current facility-administered medications on file prior to visit.     Allergies  Allergen Reactions  . Lisinopril Swelling  . Soma [Carisoprodol] Swelling  . Amlodipine Other (See Comments)    Muscle pain    Past Medical History:  Diagnosis Date  . Allergy   . Hypertension   . Sickle cell anemia (HCC)    carries the traIT   Social History   Social History Narrative   ** Merged History Encounter **       Social History   Tobacco Use  . Smoking status: Former Games developer  . Smokeless  tobacco: Never Used  Substance Use Topics  . Alcohol use: No  . Drug use: No   family history is not on file.     Objective:  BP 108/78   Pulse 75   Temp 98.8 F (37.1 C) (Oral)   Resp 18   Ht 5' 3.5" (1.613 m)   Wt 210 lb 12.8 oz (95.6 kg)   SpO2 98%   BMI 36.76 kg/m  Body mass index is 36.76 kg/m.  Wt Readings from Last 3 Encounters:  09/23/17 210 lb 12.8 oz (95.6 kg)  06/13/17 208 lb 9.6 oz (94.6 kg)  10/15/16 237 lb 12.8 oz (107.9 kg)    Physical Exam  Constitutional: She is oriented to person, place, and time. She appears well-developed and well-nourished.  HENT:  Head: Normocephalic and atraumatic.  Right Ear: Hearing and external ear normal.  Left Ear: Hearing and external ear normal.  Eyes: Conjunctivae are normal.  Neck: Normal range of motion.  Cardiovascular: Normal rate, regular rhythm and normal heart sounds.  No murmur heard. Pulmonary/Chest: Effort normal and breath sounds normal.  Musculoskeletal:       Right lower leg: She exhibits no edema.       Left lower leg: She exhibits no edema.  Neurological: She is alert and oriented to person, place, and time.  Skin: Skin is warm and dry.  Psychiatric: She has a normal mood and affect. Her behavior is  normal. Judgment and thought content normal.  Vitals reviewed.   Assessment and Plan :  Obesity, Class II, BMI 35-39.9 encourage patient to continue weight loss efforts, discussed ways to help get out of plateau with change in macro nutrient make-up with diet.  Try to get some type of activity daily.  Essential hypertension - Plan: chlorthalidone (HYGROTON) 25 MG tablet, metoprolol succinate (TOPROL-XL) 50 MG 24 hr tablet, Basic metabolic panel -controlled continue medications  Myalgia - Plan: cyclobenzaprine (FLEXERIL) 5 MG tablet -as needed  Patient verbalized to me that they understand the following: diagnosis, what is being done for them, what to expect and what should be done at home.  Their  questions have been answered.  See after visit summary for patient specific instructions.  Benny Lennert PA-C  [facility] at [facility] 09/24/2017 9:18 PM  Please note: Portions of this report may have been transcribed using dragon voice recognition software. Every effort was made to ensure accuracy; however, inadvertent computerized transcription errors may be present.

## 2017-09-23 NOTE — Patient Instructions (Addendum)
[facility] - [address] [place], [place], [place] [address] Phone: [phone]   1500-1800 calories 90-110g of protein  45-50g fat  My fitness PAL -- to help with macro nutrients  If you have lab work done today you will be contacted with your lab results within the next 2 weeks.  If you have not heard from [place] then please contact us. The fastest way to get your results is to register for My Chart.   IF you received an x-ray today, you will receive an invoice from [facility]. Please contact [facility] at [phone] with questions or concerns regarding your invoice.   IF you received labwork today, you will receive an invoice from [place]. Please contact [facility] at [phone] with questions or concerns regarding your invoice.   Our billing staff will not be able to assist you with questions regarding bills from these companies.  You will be contacted with the lab results as soon as they are available. The fastest way to get your results is to activate your My Chart account. Instructions are located on the last page of this paperwork. If you have not heard from [place] regarding the results in 2 weeks, please contact this office.

## 2017-09-24 ENCOUNTER — Encounter: Payer: Self-pay | Admitting: Physician Assistant

## 2017-09-24 LAB — BASIC METABOLIC PANEL
BUN / CREAT RATIO: 9 (ref 9–23)
BUN: 9 mg/dL (ref 6–24)
CHLORIDE: 98 mmol/L (ref 96–106)
CO2: 24 mmol/L (ref 20–29)
Calcium: 9.7 mg/dL (ref 8.7–10.2)
Creatinine, Ser: 0.95 mg/dL (ref 0.57–1.00)
GFR calc Af Amer: 82 mL/min/{1.73_m2} (ref >59)
GFR calc non Af Amer: 71 mL/min/{1.73_m2} (ref >59)
GLUCOSE: 99 mg/dL (ref 65–99)
POTASSIUM: 3.6 mmol/L (ref 3.5–5.2)
SODIUM: 139 mmol/L (ref 134–144)

## 2017-09-30 ENCOUNTER — Telehealth: Payer: Self-pay | Admitting: Physician Assistant

## 2017-09-30 NOTE — Telephone Encounter (Signed)
Labs mailed to home address listed on file.

## 2017-09-30 NOTE — Telephone Encounter (Signed)
Copied from CRM [id number]. Topic: Quick Communication - Lab Results >> Sep 30, 2017 12:21 PM Sally Rivera wrote: Pt needs to have the latest labs mailed to her, mail to labs to pt

## 2017-12-29 ENCOUNTER — Other Ambulatory Visit: Payer: Self-pay

## 2017-12-29 ENCOUNTER — Encounter: Payer: Self-pay | Admitting: Family Medicine

## 2017-12-29 ENCOUNTER — Ambulatory Visit: Payer: BLUE CROSS/BLUE SHIELD | Admitting: Family Medicine

## 2017-12-29 VITALS — BP 151/97 | HR 67 | Temp 98.3°F | Resp 18 | Ht 64.57 in | Wt 204.0 lb

## 2017-12-29 DIAGNOSIS — R509 Fever, unspecified: Secondary | ICD-10-CM | POA: Diagnosis not present

## 2017-12-29 DIAGNOSIS — M791 Myalgia, unspecified site: Secondary | ICD-10-CM

## 2017-12-29 DIAGNOSIS — J029 Acute pharyngitis, unspecified: Secondary | ICD-10-CM | POA: Diagnosis not present

## 2017-12-29 LAB — POCT RAPID STREP A (OFFICE): RAPID STREP A SCREEN: NEGATIVE

## 2017-12-29 NOTE — Patient Instructions (Addendum)
     If you have lab work done today you will be contacted with your lab results within the next 2 weeks.  If you have not heard from us then please contact us. The fastest way to get your results is to register for My Chart.   IF you received an x-ray today, you will receive an invoice from [facility]. Please contact [facility] at [phone] with questions or concerns regarding your invoice.   IF you received labwork today, you will receive an invoice from [facility]. Please contact [facility] at [phone] with questions or concerns regarding your invoice.   Our billing staff will not be able to assist you with questions regarding bills from these companies.  You will be contacted with the lab results as soon as they are available. The fastest way to get your results is to activate your My Chart account. Instructions are located on the last page of this paperwork. If you have not heard from us regarding the results in 2 weeks, please contact this office.     Sore Throat A sore throat is pain, burning, irritation, or scratchiness in the throat. When you have a sore throat, you may feel pain or tenderness in your throat when you swallow or talk. Many things can cause a sore throat, including:  An infection.  Seasonal allergies.  Dryness in the air.  Irritants, such as smoke or pollution.  Gastroesophageal reflux disease (GERD).  A tumor.  A sore throat is often the first sign of another sickness. It may happen with other symptoms, such as coughing, sneezing, fever, and swollen neck glands. Most sore throats go away without medical treatment. Follow these instructions at home:  Take over-the-counter medicines only as told by your health care provider.  Drink enough fluids to keep your urine clear or pale yellow.  Rest as needed.  To help with pain, try: ? Sipping warm liquids, such as broth, herbal tea, or warm water. ? Eating or drinking cold or frozen  liquids, such as frozen ice pops. ? Gargling with a salt-water mixture 3-4 times a day or as needed. To make a salt-water mixture, completely dissolve -1 tsp of salt in 1 cup of warm water. ? Sucking on hard candy or throat lozenges. ? Putting a cool-mist humidifier in your bedroom at night to moisten the air. ? Sitting in the bathroom with the door closed for 5-10 minutes while you run hot water in the shower.  Do not use any tobacco products, such as cigarettes, chewing tobacco, and e-cigarettes. If you need help quitting, ask your health care provider. Contact a health care provider if:  You have a fever for more than 2-3 days.  You have symptoms that last (are persistent) for more than 2-3 days.  Your throat does not get better within 7 days.  You have a fever and your symptoms suddenly get worse. Get help right away if:  You have difficulty breathing.  You cannot swallow fluids, soft foods, or your saliva.  You have increased swelling in your throat or neck.  You have persistent nausea and vomiting. This information is not intended to replace advice given to you by your health care provider. Make sure you discuss any questions you have with your health care provider. Document Released: 02/15/2004 Document Revised: 09/03/2015 Document Reviewed: 10/28/2014 Elsevier Interactive Patient Education  2018 Elsevier Inc.  

## 2017-12-29 NOTE — Progress Notes (Signed)
Established Patient Office Visit  Subjective:  Patient ID: Sally Rivera, female    DOB: [birth year]  Age: 47 y.o. MRN: [id number]  CC:  Chief Complaint  Patient presents with  . Cough    X 3 days  . Fatigue    X 1 week   . Sore Throat    X 2 days  . Generalized Body Aches    X 1 week    HPI Sally Rivera presents for cough, fatigue She reports that symptmos started a week ago with sore throat, low grade fever, generalized body aches, productive cough with yellow mucus She reports that she was sent home from work  She states that it feels like the flu  Denies diarrhea but has vomiting with postnasal drip She denies runny nose She denies ear pain She reports fevers  She reports perimenopausal hot flashes  She received the flu shot   Past Medical History:  Diagnosis Date  . Allergy   . Hypertension   . Sickle cell anemia (HCC)    carries the traIT    History reviewed. No pertinent surgical history.  History reviewed. No pertinent family history.  Social History   Socioeconomic History  . Marital status: Married    Spouse name: Not on file  . Number of children: 1  . Years of education: Not on file  . Highest education level: Not on file  Occupational History  . Not on file  Social Needs  . Financial resource strain: Not on file  . Food insecurity:    Worry: Not on file    Inability: Not on file  . Transportation needs:    Medical: Not on file    Non-medical: Not on file  Tobacco Use  . Smoking status: Former Games developermoker  . Smokeless tobacco: Never Used  Substance and Sexual Activity  . Alcohol use: Yes    Comment: occ  . Drug use: No  . Sexual activity: Yes  Lifestyle  . Physical activity:    Days per week: Not on file    Minutes per session: Not on file  . Stress: Not on file  Relationships  . Social connections:    Talks on phone: Not on file    Gets together: Not on file    Attends religious service: Not on file    Active member of club or  organization: Not on file    Attends meetings of clubs or organizations: Not on file    Relationship status: Not on file  . Intimate partner violence:    Fear of current or ex partner: Not on file    Emotionally abused: Not on file    Physically abused: Not on file    Forced sexual activity: Not on file  Other Topics Concern  . Not on file  Social History Narrative   ** Merged History Encounter **        Outpatient Medications Prior to Visit  Medication Sig Dispense Refill  . Blood Pressure Monitoring (B-D ASSURE BPM/MANUAL ARM CUFF) MISC 1 Device by Does not apply route daily. 1 each 0  . chlorthalidone (HYGROTON) 25 MG tablet TAKE 1 TABLET(25 MG) BY MOUTH DAILY 90 tablet 4  . cyclobenzaprine (FLEXERIL) 5 MG tablet TAKE 1 TABLET BY MOUTH THREE TIMES DAILY AS NEEDED FOR MUSCLE SPASMS 90 tablet 1  . hydrocortisone 2.5 % cream Apply topically 2 (two) times daily. 30 g 0  . metoprolol succinate (TOPROL-XL) 50 MG 24 hr tablet TAKE 1 TABLET  BY MOUTH DAILY. TAKE WITH OR IMMEDIATELY FOLLOWING A MEAL 90 tablet 4  . Probiotic Product (PROBIOTIC PO) Take 1 tablet by mouth daily.    . ranitidine (ZANTAC) 300 MG tablet TAKE 1/2 TABLET BY MOUTH TWICE DAILY (Patient not taking: Reported on 12/29/2017) 90 tablet 0   No facility-administered medications prior to visit.     Allergies  Allergen Reactions  . Lisinopril Swelling  . Soma [Carisoprodol] Swelling  . Amlodipine Other (See Comments)    Muscle pain    ROS Review of Systems Review of Systems  Constitutional: Negative for activity change, appetite change, see hpi HENT: Negative for congestion, nosebleeds, trouble swallowing and voice change.   Respiratory: see hpi   Gastrointestinal: Negative for diarrhea, nausea and vomiting.  Genitourinary: Negative for difficulty urinating, dysuria, flank pain and hematuria.  Musculoskeletal: Negative for back pain, joint swelling and neck pain.  Neurological: Negative for dizziness, speech  difficulty, light-headedness and numbness.  See HPI. All other review of systems negative.     Objective:    Physical Exam  BP (!) 151/97   Pulse 67   Temp 98.3 F (36.8 C) (Oral)   Resp 18   Ht 5' 4.57" (1.64 m)   Wt 204 lb (92.5 kg)   SpO2 97%   BMI 34.40 kg/m  Wt Readings from Last 3 Encounters:  12/29/17 204 lb (92.5 kg)  09/23/17 210 lb 12.8 oz (95.6 kg)  06/13/17 208 lb 9.6 oz (94.6 kg)   General: alert, oriented, in NAD Head: normocephalic, atraumatic, no sinus tenderness Eyes: EOM intact, no scleral icterus or conjunctival injection Ears: TM clear bilaterally Nose: mucosa nonerythematous, nonedematous Throat: no pharyngeal exudate or erythema Lymph: no posterior auricular, submental or cervical lymph adenopathy Heart: normal rate, normal sinus rhythm, no murmurs Lungs: clear to auscultation bilaterally, no wheezing     Health Maintenance Due  Topic Date Due  . HIV Screening  09/11/1984  . MAMMOGRAM  11/21/2016    There are no preventive care reminders to display for this patient.  Lab Results  Component Value Date   TSH 1.160 04/18/2016   No results found for: WBC, HGB, HCT, MCV, PLT Lab Results  Component Value Date   NA 139 09/23/2017   K 3.6 09/23/2017   CO2 24 09/23/2017   GLUCOSE 99 09/23/2017   BUN 9 09/23/2017   CREATININE 0.95 09/23/2017   BILITOT 0.5 06/13/2017   ALKPHOS 100 06/13/2017   AST 13 06/13/2017   ALT 13 06/13/2017   PROT 7.0 06/13/2017   ALBUMIN 4.2 06/13/2017   CALCIUM 9.7 09/23/2017   Lab Results  Component Value Date   CHOL 192 06/13/2017   Lab Results  Component Value Date   HDL 40 06/13/2017   Lab Results  Component Value Date   LDLCALC 129 (H) 06/13/2017   Lab Results  Component Value Date   TRIG 115 06/13/2017   Lab Results  Component Value Date   CHOLHDL 4.8 (H) 06/13/2017   No results found for: HGBA1C    Assessment & Plan:   Problem List Items Addressed This Visit    None    Visit  Diagnoses    Sore throat    -  Primary   Relevant Orders   POCT rapid strep A (Completed)   Myalgia       Fever and chills          Discussed viral etiology Discussed otc decongestant Advised flonase for congestion  Increase hydration Vitamin C  and zinc lozenges suggested Return to clinic if symptoms worse Strep Negative No orders of the defined types were placed in this encounter.   Follow-up: Return if symptoms worsen or fail to improve.    Doristine Bosworth, MD

## 2018-05-14 ENCOUNTER — Ambulatory Visit: Payer: Self-pay | Admitting: *Deleted

## 2018-05-14 NOTE — Telephone Encounter (Signed)
Patient is calling with fever, vomiting and headache.  Reason for Disposition . 1] COVID-19 infection diagnosed or suspected AND [2] mild symptoms (fever, cough) AND [2] no trouble breathing or other complications  Additional Information . Other symptom is present, see that guideline  (e.g., symptoms of cough, runny nose, sore throat, earache, abdominal pain, diarrhea, vomiting)    Patient did vomit this morning- with headache  Answer Assessment - Initial Assessment Questions 1. TEMPERATURE: "What is the most recent temperature?"  "How was it measured?"      100.5 -  98.9( motrin- 7:30 this morning) oral thermometer 2. ONSET: "When did the fever start?"      Patient woke with it 3. SYMPTOMS: "Do you have any other symptoms besides the fever?"  (e.g., colds, headache, sore throat, earache, cough, rash, diarrhea, vomiting, abdominal pain)     Vomiting , headache 4. CAUSE: If there are no symptoms, ask: "What do you think is causing the fever?"      n/a 5. CONTACTS: "Does anyone else in the family have an infection?"     No family members sick 6. TREATMENT: "What have you done so far to treat this fever?" (e.g., medications)     Motrin 7. IMMUNOCOMPROMISE: "Do you have of the following: diabetes, HIV positive, splenectomy, cancer chemotherapy, chronic steroid treatment, transplant patient, etc."     no 8. PREGNANCY: "Is there any chance you are pregnant?" "When was your last menstrual period?"     n/a 9. TRAVEL: "Have you traveled out of the country in the last month?" (e.g., travel history, exposures)     No travel- no known exposure- patient does work at pharmacy  Answer Assessment - Initial Assessment Questions 1. COVID-19 DIAGNOSIS: "Who made your Coronavirus (COVID-19) diagnosis?" "Was it confirmed by a positive lab test?" If not diagnosed by a HCP, ask "Are there lots of cases (community spread) where you live?" (See public health department website, if unsure)   * MAJOR community  spread: high number of cases; numbers of cases are increasing; many people hospitalized.   * MINOR community spread: low number of cases; not increasing; few or no people hospitalized     Community spread 2. ONSET: "When did the COVID-19 symptoms start?"      Today- fever 3. WORST SYMPTOM: "What is your worst symptom?" (e.g., cough, fever, shortness of breath, muscle aches)    Fever- Headache, vomiting 4. COUGH: "How bad is the cough?"       no 5. FEVER: "Do you have a fever?" If so, ask: "What is your temperature, how was it measured, and when did it start?"     100.5 this morning 6. RESPIRATORY STATUS: "Describe your breathing?" (e.g., shortness of breath, wheezing, unable to speak)      normally 7. BETTER-SAME-WORSE: "Are you getting better, staying the same or getting worse compared to yesterday?"  If getting worse, ask, "In what way?"     Yesterday no symptoms-worse today 8. HIGH RISK DISEASE: "Do you have any chronic medical problems?" (e.g., asthma, heart or lung disease, weak immune system, etc.)     no 9. PREGNANCY: "Is there any chance you are pregnant?" "When was your last menstrual period?"     n/a 10. OTHER SYMPTOMS: "Do you have any other symptoms?"  (e.g., runny nose, headache, sore throat, loss of smell)       Headache, vomit  Protocols used: CORONAVIRUS (COVID-19) DIAGNOSED OR SUSPECTED-A-AH, FEVER-A-AH

## 2018-05-14 NOTE — Telephone Encounter (Signed)
Due to power outage call was dropped during triage- unable to contact office- patient was instructed to self isolate and virtual visit needs to be scheduled.

## 2018-05-15 ENCOUNTER — Telehealth (INDEPENDENT_AMBULATORY_CARE_PROVIDER_SITE_OTHER): Payer: 59 | Admitting: Family Medicine

## 2018-05-15 DIAGNOSIS — R509 Fever, unspecified: Secondary | ICD-10-CM

## 2018-05-15 DIAGNOSIS — R1114 Bilious vomiting: Secondary | ICD-10-CM

## 2018-05-15 DIAGNOSIS — Z7189 Other specified counseling: Secondary | ICD-10-CM

## 2018-05-15 NOTE — Progress Notes (Signed)
Thew up yesterday morning and has a temp of 96.7 as of right now.  Pt states that she had a fever last night as well.

## 2018-05-15 NOTE — Progress Notes (Signed)
Virtual Visit Note  I connected with patient on 05/15/18 at 832am by telephone and verified that I am speaking with the correct person using two identifiers. Sally Rivera is currently located at home and patient is currently with them during visit. The provider, Myles LippsIrma M Santiago, MD is located in their office at time of visit.  I discussed the limitations, risks, security and privacy concerns of performing an evaluation and management service by telephone and the availability of in person appointments. I also discussed with the patient that there may be a patient responsible charge related to this service. The patient expressed understanding and agreed to proceed.   CC: fever and diarrhea  HPI ? Yesterday morning woke up with a Temp 100.5 and vomited, gastric acid She works as Associate Professorpharmacy tech at [employer] Yesterday afternoon temp was 98.9 Last ibuprofen 400mg  yesterday ~ 8am Has not felt feverish since then Today temp 96.7 No more vomiting Denies any diarrhea, sore throat, cough, runny nose, nasal congestion, ear pain, SOB Night before had take out, nobody else ate from her meal Today feels a bit worn out but does not feel sick Patient is deemed to be low risk for covid 19 complications   Allergies  Allergen Reactions   Lisinopril Swelling   Soma [Carisoprodol] Swelling   Amlodipine Other (See Comments)    Muscle pain    Prior to Admission medications   Medication Sig Start Date End Date Taking? Authorizing Provider  Blood Pressure Monitoring (B-D ASSURE BPM/MANUAL ARM CUFF) MISC 1 Device by Does not apply route daily. 04/18/16  Yes Weber, Sarah L, PA-C  chlorthalidone (HYGROTON) 25 MG tablet TAKE 1 TABLET(25 MG) BY MOUTH DAILY 09/23/17  Yes Weber, Sarah L, PA-C  cyclobenzaprine (FLEXERIL) 5 MG tablet TAKE 1 TABLET BY MOUTH THREE TIMES DAILY AS NEEDED FOR MUSCLE SPASMS 09/23/17  Yes Weber, Dema SeverinSarah L, PA-C  hydrocortisone 2.5 % cream Apply topically 2 (two) times daily. 06/13/17  Yes  Weber, Sarah L, PA-C  metoprolol succinate (TOPROL-XL) 50 MG 24 hr tablet TAKE 1 TABLET BY MOUTH DAILY. TAKE WITH OR IMMEDIATELY FOLLOWING A MEAL 09/23/17  Yes Weber, Sarah L, PA-C  Probiotic Product (PROBIOTIC PO) Take 1 tablet by mouth daily.   Yes [provider]  lisinopril-hydrochlorothiazide (PRINZIDE,ZESTORETIC) 20-25 MG tablet Take 1 tablet by mouth daily. 10/17/14 12/14/14  [provider]    Past Medical History:  Diagnosis Date   Allergy    Hypertension    Sickle cell anemia (HCC)    carries the traIT    No past surgical history on file.  Social History   Tobacco Use   Smoking status: Former Smoker   Smokeless tobacco: Never Used  Substance Use Topics   Alcohol use: Yes    Comment: occ    No family history on file.  ROS Per hpi  Objective  Vitals as reported by the patient: as above   ASSESSMENT and PLAN  1. Fever, unspecified 2. Bilious vomiting with nausea 3. Educated About Covid-19 Virus Infection Patient with one episode of fever and vomiting 24 hours ago. Today overall feeling well. Most likely food poisoning, but cant rule out covid 19 at this point. Advised monitoring of symptoms incl temperature with re-evaluation in 3 days. In meanwhile supportive care recommended. Work note will be given. ER precautions reviewed.  FOLLOW-UP: 3 days   The above assessment and management plan was discussed with the patient. The patient verbalized understanding of and has agreed to the management plan.  Patient is aware to call the clinic if symptoms persist or worsen. Patient is aware when to return to the clinic for a follow-up visit. Patient educated on when it is appropriate to go to the emergency department.    I provided 17 minutes of non-face-to-face time during this encounter.  Myles Lipps, MD Primary Care at [facility] [address] [place], [place] [address] Ph.  [phone] Fax [phone]

## 2018-05-15 NOTE — Telephone Encounter (Signed)
Pt is having a virtual visit right now.

## 2018-05-18 ENCOUNTER — Telehealth (INDEPENDENT_AMBULATORY_CARE_PROVIDER_SITE_OTHER): Payer: 59 | Admitting: Registered Nurse

## 2018-05-18 ENCOUNTER — Other Ambulatory Visit: Payer: Self-pay

## 2018-05-18 ENCOUNTER — Encounter: Payer: Self-pay | Admitting: Registered Nurse

## 2018-05-18 VITALS — Temp 97.4°F

## 2018-05-18 DIAGNOSIS — R509 Fever, unspecified: Secondary | ICD-10-CM

## 2018-05-18 NOTE — Progress Notes (Signed)
Pt states she need for follow-up from appointment on 05/15/2018. Pt states she has been checking her Temp everyday and the fever has cleared up. Today temp was 97.4. She also states there is no N/V anymore at this time and she feels much better. She would need a work note after appointment today.

## 2018-05-18 NOTE — Progress Notes (Signed)
Telemedicine Encounter- SOAP NOTE Established Patient  This telephone encounter was conducted with the patient's (or proxy's) verbal consent via audio telecommunications: yes   Patient was instructed to have this encounter in a suitably private space; and to only have persons present to whom they give permission to participate. In addition, patient identity was confirmed by use of name plus two identifiers (DOB and address).  I discussed the limitations, risks, security and privacy concerns of performing an evaluation and management service by telephone and the availability of in person appointments. I also discussed with the patient that there may be a patient responsible charge related to this service. The patient expressed understanding and agreed to proceed.  I spent a total of 6 minutes talking with the patient or their proxy.  CC: Follow up for fever and vomiting 4 days prior to visit.  Subjective   Sally Rivera is a 48 y.o. established patient. Telephone visit today for  Pt reports a fever of 100.30F and one day of bilious vomiting on 05/14/18. At this time she followed her employer's protocol of calling out from work and scheduling a visit. She saw Dr. Leretha PolSantiago last Friday, 05/15/18, who suspected food poisoning at that time.  Pt reports today that she has taken home temperatures that have all been wnl since Friday. She has avoided OTC analgesics in that time. She has had no further episodes of vomiting.  She denies diarrhea, SOB, wheezing, cough, and headache.    Patient Active Problem List   Diagnosis Date Noted  . Obesity, Class II, BMI 35-39.9 10/15/2016  . HTN (hypertension) 04/18/2016    Past Medical History:  Diagnosis Date  . Allergy   . Hypertension   . Sickle cell anemia (HCC)    carries the traIT    Current Outpatient Medications  Medication Sig Dispense Refill  . Blood Pressure Monitoring (B-D ASSURE BPM/MANUAL ARM CUFF) MISC 1 Device by Does not apply  route daily. 1 each 0  . chlorthalidone (HYGROTON) 25 MG tablet TAKE 1 TABLET(25 MG) BY MOUTH DAILY 90 tablet 4  . cyclobenzaprine (FLEXERIL) 5 MG tablet TAKE 1 TABLET BY MOUTH THREE TIMES DAILY AS NEEDED FOR MUSCLE SPASMS 90 tablet 1  . hydrocortisone 2.5 % cream Apply topically 2 (two) times daily. 30 g 0  . metoprolol succinate (TOPROL-XL) 50 MG 24 hr tablet TAKE 1 TABLET BY MOUTH DAILY. TAKE WITH OR IMMEDIATELY FOLLOWING A MEAL 90 tablet 4  . Probiotic Product (PROBIOTIC PO) Take 1 tablet by mouth daily.     No current facility-administered medications for this visit.     Allergies  Allergen Reactions  . Lisinopril Swelling  . Soma [Carisoprodol] Swelling  . Amlodipine Other (See Comments)    Muscle pain    Social History   Socioeconomic History  . Marital status: Married    Spouse name: Not on file  . Number of children: 1  . Years of education: Not on file  . Highest education level: Not on file  Occupational History  . Not on file  Social Needs  . Financial resource strain: Not on file  . Food insecurity:    Worry: Not on file    Inability: Not on file  . Transportation needs:    Medical: Not on file    Non-medical: Not on file  Tobacco Use  . Smoking status: Former Games developermoker  . Smokeless tobacco: Never Used  Substance and Sexual Activity  . Alcohol use: Yes    Comment:  occ  . Drug use: No  . Sexual activity: Yes  Lifestyle  . Physical activity:    Days per week: Not on file    Minutes per session: Not on file  . Stress: Not on file  Relationships  . Social connections:    Talks on phone: Not on file    Gets together: Not on file    Attends religious service: Not on file    Active member of club or organization: Not on file    Attends meetings of clubs or organizations: Not on file    Relationship status: Not on file  . Intimate partner violence:    Fear of current or ex partner: Not on file    Emotionally abused: Not on file    Physically abused: Not  on file    Forced sexual activity: Not on file  Other Topics Concern  . Not on file  Social History Narrative   ** Merged History Encounter **        Review of Systems  Constitutional: Negative for chills, fever and malaise/fatigue.  HENT: Negative for congestion and sore throat.   Respiratory: Negative for cough, shortness of breath and wheezing.   Cardiovascular: Negative for chest pain.  Gastrointestinal: Negative for constipation, diarrhea, nausea and vomiting.    Objective   Vitals as reported by the patient: Today's Vitals   05/18/18 1117  Temp: (!) 97.4 F (36.3 C)    Diagnoses and all orders for this visit:  Fever, unspecified  PLAN:  Pt symptoms resolved x 72 hours. OK to return to work. Letter will be faxed.   Pt encouraged to contact clinic with any further questions, comments, or concerns.    I discussed the assessment and treatment plan with the patient. The patient was provided an opportunity to ask questions and all were answered. The patient agreed with the plan and demonstrated an understanding of the instructions.   The patient was advised to call back or seek an in-person evaluation if the symptoms worsen or if the condition fails to improve as anticipated.  I provided 6 minutes of non-face-to-face time during this encounter.  Janeece Agee, NP  Primary Care at [facility]

## 2018-05-19 ENCOUNTER — Telehealth: Payer: Self-pay | Admitting: Family Medicine

## 2018-05-19 NOTE — Telephone Encounter (Signed)
Copied from CRM [id number]. Topic: Quick Communication - See Telephone Encounter >> May 18, 2018  5:27 PM Aretta Nip wrote: CRM for notification. See Telephone encounter for: 05/18/18.PT called returning message to Clinton Gallant for her employer's fax # [phone]

## 2018-05-25 NOTE — Telephone Encounter (Signed)
Paperwork has already been faxed

## 2018-06-29 ENCOUNTER — Ambulatory Visit (INDEPENDENT_AMBULATORY_CARE_PROVIDER_SITE_OTHER): Payer: 59 | Admitting: Family Medicine

## 2018-06-29 ENCOUNTER — Other Ambulatory Visit: Payer: Self-pay

## 2018-06-29 ENCOUNTER — Encounter: Payer: Self-pay | Admitting: Family Medicine

## 2018-06-29 VITALS — BP 132/84 | HR 64 | Temp 97.7°F | Ht 64.57 in | Wt 208.0 lb

## 2018-06-29 DIAGNOSIS — M25571 Pain in right ankle and joints of right foot: Secondary | ICD-10-CM | POA: Diagnosis not present

## 2018-06-29 DIAGNOSIS — M25471 Effusion, right ankle: Secondary | ICD-10-CM | POA: Diagnosis not present

## 2018-06-29 MED ORDER — IBUPROFEN 800 MG PO TABS: 800.0000 mg | ORAL_TABLET | Freq: Three times a day (TID) | ORAL | 0 refills | Status: DC | PRN

## 2018-06-29 NOTE — Patient Instructions (Signed)
° ° ° °  If you have lab work done today you will be contacted with your lab results within the next 2 weeks.  If you have not heard from us then please contact us. The fastest way to get your results is to register for My Chart. ° ° °IF you received an x-ray today, you will receive an invoice from [facility]. Please contact [facility] at [phone] with questions or concerns regarding your invoice.  ° °IF you received labwork today, you will receive an invoice from [facility]. Please contact [facility] at [phone] with questions or concerns regarding your invoice.  ° °Our billing staff will not be able to assist you with questions regarding bills from these companies. ° °You will be contacted with the lab results as soon as they are available. The fastest way to get your results is to activate your My Chart account. Instructions are located on the last page of this paperwork. If you have not heard from us regarding the results in 2 weeks, please contact this office. °  ° ° ° °

## 2018-06-29 NOTE — Progress Notes (Signed)
6/8/20202:45 PM  Sally Rivera 08-23-1970, 48 y.o., female [id number]  Chief Complaint  Patient presents with  . Ankle Pain    hurt ankle last night by doing cartwheel. Has been icing and elevating since alst night    HPI:   Patient is a 48 y.o. female who presents today for right ankle pain and swelling Yesterday she was doing a cartwheel, feels she rolled her ankle Pain on both side of ankle Was able to limp on it Ice and elevating, using voltaren gel No bruising No previous fractures, has sprained multiple times   Fall Risk  06/29/2018 05/18/2018 05/15/2018 12/29/2017 09/23/2017  Falls in the past year? 0 0 0 0 No  Number falls in past yr: 0 - 0 - -  Injury with Fall? 0 0 0 - -  Follow up - - Falls evaluation completed - -     [facility] 05/18/2018 05/15/2018 12/29/2017  Decreased Interest 0 0 0  Down, Depressed, Hopeless 0 0 0  PHQ - 2 Score 0 0 0    Allergies  Allergen Reactions  . Lisinopril Swelling  . Soma [Carisoprodol] Swelling  . Amlodipine Other (See Comments)    Muscle pain    Prior to Admission medications   Medication Sig Start Date End Date Taking? Authorizing Provider  Blood Pressure Monitoring (B-D ASSURE BPM/MANUAL ARM CUFF) MISC 1 Device by Does not apply route daily. 04/18/16  Yes Weber, Sarah L, PA-C  chlorthalidone (HYGROTON) 25 MG tablet TAKE 1 TABLET(25 MG) BY MOUTH DAILY 09/23/17  Yes Weber, Sarah L, PA-C  cyclobenzaprine (FLEXERIL) 5 MG tablet TAKE 1 TABLET BY MOUTH THREE TIMES DAILY AS NEEDED FOR MUSCLE SPASMS 09/23/17  Yes Weber, Dema SeverinSarah L, PA-C  hydrocortisone 2.5 % cream Apply topically 2 (two) times daily. 06/13/17  Yes Weber, Sarah L, PA-C  metoprolol succinate (TOPROL-XL) 50 MG 24 hr tablet TAKE 1 TABLET BY MOUTH DAILY. TAKE WITH OR IMMEDIATELY FOLLOWING A MEAL 09/23/17  Yes Weber, Sarah L, PA-C  Probiotic Product (PROBIOTIC PO) Take 1 tablet by mouth daily.   Yes [provider]  lisinopril-hydrochlorothiazide  (PRINZIDE,ZESTORETIC) 20-25 MG tablet Take 1 tablet by mouth daily. 10/17/14 12/14/14  [provider]    Past Medical History:  Diagnosis Date  . Allergy   . Hypertension   . Sickle cell anemia (HCC)    carries the traIT    History reviewed. No pertinent surgical history.  Social History   Tobacco Use  . Smoking status: Former Games developermoker  . Smokeless tobacco: Never Used  Substance Use Topics  . Alcohol use: Yes    Comment: occ    History reviewed. No pertinent family history.  ROS Per hpi  OBJECTIVE:  Today's Vitals   06/29/18 1421  BP: 132/84  Pulse: 64  Temp: 97.7 F (36.5 C)  TempSrc: Oral  SpO2: 97%  Weight: 208 lb (94.3 kg)  Height: 5' 4.57" (1.64 m)   Body mass index is 35.08 kg/m.   Physical Exam Vitals signs and nursing note reviewed.  Constitutional:      Appearance: She is well-developed.  HENT:     Head: Normocephalic and atraumatic.  Eyes:     General: No scleral icterus.    Conjunctiva/sclera: Conjunctivae normal.     Pupils: Pupils are equal, round, and reactive to light.  Neck:     Musculoskeletal: Neck supple.  Pulmonary:     Effort: Pulmonary effort is normal.  Musculoskeletal:     Right ankle:  She exhibits decreased range of motion and swelling. She exhibits no ecchymosis, no deformity and normal pulse. Tenderness. Lateral malleolus tenderness found. No medial malleolus tenderness found. Achilles tendon exhibits pain. Achilles tendon exhibits no defect.  Skin:    General: Skin is warm and dry.  Neurological:     Mental Status: She is alert and oriented to person, place, and time.      ASSESSMENT and PLAN  1. Pain and swelling of right ankle Discussed rice therapy, xray to r/o fracture - today at [place].  - DG Ankle Complete Right; Future - Apply ASO ankle  Other orders - ibuprofen (ADVIL) 800 MG tablet; Take 1 tablet (800 mg total) by mouth every 8 (eight) hours as needed.  Return if symptoms worsen or fail to  improve.    Rutherford Guys, MD Primary Care at [facility] [place], [place] [address] Ph.  [phone] Fax [phone]

## 2018-11-30 ENCOUNTER — Telehealth: Payer: Self-pay | Admitting: Family Medicine

## 2018-11-30 NOTE — Telephone Encounter (Signed)
Pt requesting med refill. Did not say which she needed except for that she submitted a request which was denied. Pt made ov for 11/16

## 2018-11-30 NOTE — Telephone Encounter (Signed)
LVM to call office back about Rx refill request.

## 2018-12-07 ENCOUNTER — Ambulatory Visit (INDEPENDENT_AMBULATORY_CARE_PROVIDER_SITE_OTHER): Payer: 59 | Admitting: Family Medicine

## 2018-12-07 ENCOUNTER — Encounter: Payer: Self-pay | Admitting: Family Medicine

## 2018-12-07 ENCOUNTER — Other Ambulatory Visit: Payer: Self-pay

## 2018-12-07 VITALS — BP 148/90 | HR 77 | Temp 98.0°F | Resp 16 | Ht 63.39 in | Wt 201.0 lb

## 2018-12-07 DIAGNOSIS — Z1231 Encounter for screening mammogram for malignant neoplasm of breast: Secondary | ICD-10-CM | POA: Diagnosis not present

## 2018-12-07 DIAGNOSIS — I10 Essential (primary) hypertension: Secondary | ICD-10-CM

## 2018-12-07 MED ORDER — METOPROLOL SUCCINATE ER 50 MG PO TB24: ORAL_TABLET | ORAL | 3 refills | Status: DC

## 2018-12-07 MED ORDER — CHLORTHALIDONE 25 MG PO TABS: ORAL_TABLET | ORAL | 3 refills | Status: DC

## 2018-12-07 NOTE — Patient Instructions (Addendum)
  We recommend that you schedule a mammogram for breast cancer screening. Typically, you do not need a referral to do this. Please contact a local imaging center to schedule your mammogram.  [facility] - [phone] or [phone]     If you have lab work done today you will be contacted with your lab results within the next 2 weeks.  If you have not heard from us then please contact us. The fastest way to get your results is to register for My Chart.   IF you received an x-ray today, you will receive an invoice from [facility]. Please contact [facility] at [phone] with questions or concerns regarding your invoice.   IF you received labwork today, you will receive an invoice from [facility]. Please contact [facility] at [phone] with questions or concerns regarding your invoice.   Our billing staff will not be able to assist you with questions regarding bills from these companies.  You will be contacted with the lab results as soon as they are available. The fastest way to get your results is to activate your My Chart account. Instructions are located on the last page of this paperwork. If you have not heard from us regarding the results in 2 weeks, please contact this office.     

## 2018-12-07 NOTE — Progress Notes (Signed)
Established Patient Office Visit  Subjective:  Patient ID: Sally Rivera, female    DOB: Jul 26, 1970  Age: 48 y.o. MRN: [id number]  CC:  Chief Complaint  Patient presents with  . Hypertension    pt needs follow-up for management of meds   . Medication Refill    all meds has not had HTN meds in over 1 week     HPI Sally Rivera presents for   Hypertension: Patient here for follow-up of elevated blood pressure. She is exercising and is adherent to low salt diet.  Blood pressure is well controlled at home but ran out of her medications a year ago. Cardiac symptoms none. Patient denies chest pain, claudication, exertional chest pressure/discomfort, fatigue, irregular heart beat, near-syncope and orthopnea.  Cardiovascular risk factors: hypertension. Use of agents associated with hypertension: none. History of  target organ damage: none. BP Readings from Last 3 Encounters:  12/07/18 (!) 148/90  06/29/18 132/84  12/29/17 (!) 151/97   Eye exam: not up to date   Screening for breast cancer Denies family history of breast cancer Mammogram not up to date Last mammogram in 2016 Denies any breast pain   Past Medical History:  Diagnosis Date  . Allergy   . Hypertension   . Sickle cell anemia (HCC)    carries the traIT    History reviewed. No pertinent surgical history.  History reviewed. No pertinent family history.  Social History   Socioeconomic History  . Marital status: Married    Spouse name: Not on file  . Number of children: 1  . Years of education: Not on file  . Highest education level: Not on file  Occupational History  . Not on file  Social Needs  . Financial resource strain: Not on file  . Food insecurity    Worry: Not on file    Inability: Not on file  . Transportation needs    Medical: Not on file    Non-medical: Not on file  Tobacco Use  . Smoking status: Former Research scientist (life sciences)  . Smokeless tobacco: Never Used  Substance and Sexual Activity  . Alcohol use:  Yes    Comment: occ  . Drug use: No  . Sexual activity: Yes  Lifestyle  . Physical activity    Days per week: Not on file    Minutes per session: Not on file  . Stress: Not on file  Relationships  . Social Herbalist on phone: Not on file    Gets together: Not on file    Attends religious service: Not on file    Active member of club or organization: Not on file    Attends meetings of clubs or organizations: Not on file    Relationship status: Not on file  . Intimate partner violence    Fear of current or ex partner: Not on file    Emotionally abused: Not on file    Physically abused: Not on file    Forced sexual activity: Not on file  Other Topics Concern  . Not on file  Social History Narrative   ** Merged History Encounter **        Outpatient Medications Prior to Visit  Medication Sig Dispense Refill  . Blood Pressure Monitoring (B-D ASSURE BPM/MANUAL ARM CUFF) MISC 1 Device by Does not apply route daily. 1 each 0  . cyclobenzaprine (FLEXERIL) 5 MG tablet TAKE 1 TABLET BY MOUTH THREE TIMES DAILY AS NEEDED FOR MUSCLE SPASMS 90 tablet 1  .  hydrocortisone 2.5 % cream Apply topically 2 (two) times daily. 30 g 0  . ibuprofen (ADVIL) 800 MG tablet Take 1 tablet (800 mg total) by mouth every 8 (eight) hours as needed. 30 tablet 0  . Probiotic Product (PROBIOTIC PO) Take 1 tablet by mouth daily.    . chlorthalidone (HYGROTON) 25 MG tablet TAKE 1 TABLET(25 MG) BY MOUTH DAILY 90 tablet 4  . metoprolol succinate (TOPROL-XL) 50 MG 24 hr tablet TAKE 1 TABLET BY MOUTH DAILY. TAKE WITH OR IMMEDIATELY FOLLOWING A MEAL 90 tablet 4   No facility-administered medications prior to visit.     Allergies  Allergen Reactions  . Lisinopril Swelling  . Soma [Carisoprodol] Swelling  . Amlodipine Other (See Comments)    Muscle pain    ROS Review of Systems Review of Systems  Constitutional: Negative for activity change, appetite change, chills and fever.  HENT: Negative for  congestion, nosebleeds, trouble swallowing and voice change.   Respiratory: Negative for cough, shortness of breath and wheezing.   Gastrointestinal: Negative for diarrhea, nausea and vomiting.  Genitourinary: Negative for difficulty urinating, dysuria, flank pain and hematuria.  Musculoskeletal: Negative for back pain, joint swelling and neck pain.  Neurological: Negative for dizziness, speech difficulty, light-headedness and numbness.  See HPI. All other review of systems negative.     Objective:    Physical Exam  BP (!) 148/90   Pulse 77   Temp 98 F (36.7 C) (Oral)   Resp 16   Ht 5' 3.39" (1.61 m)   Wt 201 lb (91.2 kg)   LMP 09/11/2016   SpO2 97%   BMI 35.17 kg/m  Wt Readings from Last 3 Encounters:  12/07/18 201 lb (91.2 kg)  06/29/18 208 lb (94.3 kg)  12/29/17 204 lb (92.5 kg)   Physical Exam  Constitutional: Oriented to person, place, and time. Appears well-developed and well-nourished.  HENT:  Head: Normocephalic and atraumatic.  Eyes: Conjunctivae and EOM are normal.  Cardiovascular: Normal rate, regular rhythm, normal heart sounds and intact distal pulses.  No murmur heard. Pulmonary/Chest: Effort normal and breath sounds normal. No stridor. No respiratory distress. Has no wheezes.  Neurological: Is alert and oriented to person, place, and time.  Skin: Skin is warm. Capillary refill takes less than 2 seconds.  Psychiatric: Has a normal mood and affect. Behavior is normal. Judgment and thought content normal.    Health Maintenance Due  Topic Date Due  . MAMMOGRAM  11/21/2016    There are no preventive care reminders to display for this patient.  Lab Results  Component Value Date   TSH 1.160 04/18/2016   No results found for: WBC, HGB, HCT, MCV, PLT Lab Results  Component Value Date   NA 139 09/23/2017   K 3.6 09/23/2017   CO2 24 09/23/2017   GLUCOSE 99 09/23/2017   BUN 9 09/23/2017   CREATININE 0.95 09/23/2017   BILITOT 0.5 06/13/2017   ALKPHOS  100 06/13/2017   AST 13 06/13/2017   ALT 13 06/13/2017   PROT 7.0 06/13/2017   ALBUMIN 4.2 06/13/2017   CALCIUM 9.7 09/23/2017   Lab Results  Component Value Date   CHOL 192 06/13/2017   Lab Results  Component Value Date   HDL 40 06/13/2017   Lab Results  Component Value Date   LDLCALC 129 (H) 06/13/2017   Lab Results  Component Value Date   TRIG 115 06/13/2017   Lab Results  Component Value Date   CHOLHDL 4.8 (H) 06/13/2017  No results found for: HGBA1C    Assessment & Plan:   Problem List Items Addressed This Visit      Cardiovascular and Mediastinum   HTN (hypertension) - Primary   Relevant Medications   metoprolol succinate (TOPROL-XL) 50 MG 24 hr tablet   chlorthalidone (HYGROTON) 25 MG tablet   Other Relevant Orders   CMP14+EGFR   Microalbumin, urine   Lipid panel    Other Visit Diagnoses    Encounter for screening mammogram for malignant neoplasm of breast       Relevant Orders   MM Digital Screening     Patient advised to get her mammogram  She should gets an eye exam for HYPERTENSION She should continue DASH DIET Resume her bp meds -- refills sent today Will monitor lytes and renal function   Meds ordered this encounter  Medications  . metoprolol succinate (TOPROL-XL) 50 MG 24 hr tablet    Sig: TAKE 1 TABLET BY MOUTH DAILY. TAKE WITH OR IMMEDIATELY FOLLOWING A MEAL    Dispense:  90 tablet    Refill:  3  . chlorthalidone (HYGROTON) 25 MG tablet    Sig: TAKE 1 TABLET(25 MG) BY MOUTH DAILY    Dispense:  90 tablet    Refill:  3    Follow-up: Return in about 1 year (around 12/07/2019).    Forrest Moron, MD

## 2018-12-08 LAB — CMP14+EGFR
ALT: 14 IU/L (ref 0–32)
AST: 17 IU/L (ref 0–40)
Albumin/Globulin Ratio: 1.6 (ref 1.2–2.2)
Albumin: 4.5 g/dL (ref 3.8–4.8)
Alkaline Phosphatase: 81 IU/L (ref 39–117)
BUN/Creatinine Ratio: 11 (ref 9–23)
BUN: 9 mg/dL (ref 6–24)
Bilirubin Total: 0.5 mg/dL (ref 0.0–1.2)
CO2: 26 mmol/L (ref 20–29)
Calcium: 10 mg/dL (ref 8.7–10.2)
Chloride: 98 mmol/L (ref 96–106)
Creatinine, Ser: 0.8 mg/dL (ref 0.57–1.00)
GFR calc Af Amer: 100 mL/min/{1.73_m2} (ref >59)
GFR calc non Af Amer: 87 mL/min/{1.73_m2} (ref >59)
Globulin, Total: 2.8 g/dL (ref 1.5–4.5)
Glucose: 101 mg/dL — ABNORMAL HIGH (ref 65–99)
Potassium: 3.4 mmol/L — ABNORMAL LOW (ref 3.5–5.2)
Sodium: 140 mmol/L (ref 134–144)
Total Protein: 7.3 g/dL (ref 6.0–8.5)

## 2018-12-08 LAB — LIPID PANEL
Chol/HDL Ratio: 4.6 ratio — ABNORMAL HIGH (ref 0.0–4.4)
Cholesterol, Total: 197 mg/dL (ref 100–199)
HDL: 43 mg/dL (ref >39)
LDL Chol Calc (NIH): 127 mg/dL — ABNORMAL HIGH (ref 0–99)
Triglycerides: 150 mg/dL — ABNORMAL HIGH (ref 0–149)
VLDL Cholesterol Cal: 27 mg/dL (ref 5–40)

## 2018-12-08 LAB — MICROALBUMIN, URINE: Microalbumin, Urine: 4.9 ug/mL

## 2019-02-26 ENCOUNTER — Other Ambulatory Visit: Payer: Self-pay

## 2019-02-26 ENCOUNTER — Ambulatory Visit
Admission: RE | Admit: 2019-02-26 | Discharge: 2019-02-26 | Disposition: A | Discharge status: Home / Self Care | Payer: BLUE CROSS/BLUE SHIELD | Source: Ambulatory Visit | Attending: Family Medicine | Admitting: Family Medicine

## 2019-02-26 DIAGNOSIS — Z1231 Encounter for screening mammogram for malignant neoplasm of breast: Secondary | ICD-10-CM

## 2019-06-23 ENCOUNTER — Other Ambulatory Visit: Payer: Self-pay

## 2019-06-23 NOTE — Telephone Encounter (Signed)
Called and left voice mail to offer patient an appt for fax request for cyclobenzaprine 5 mg last rx in 2019 .

## 2019-12-06 ENCOUNTER — Telehealth: Payer: Self-pay | Admitting: Family Medicine

## 2019-12-06 ENCOUNTER — Other Ambulatory Visit: Payer: Self-pay

## 2019-12-06 DIAGNOSIS — I10 Essential (primary) hypertension: Secondary | ICD-10-CM

## 2019-12-06 MED ORDER — METOPROLOL SUCCINATE ER 50 MG PO TB24: ORAL_TABLET | ORAL | 0 refills | Status: DC

## 2019-12-06 MED ORDER — CHLORTHALIDONE 25 MG PO TABS: ORAL_TABLET | ORAL | 0 refills | Status: DC

## 2019-12-06 NOTE — Telephone Encounter (Signed)
Pt called needing a curtesy refill on Rx listed below. Pt has an appt on 11/17 with FNP Just for refill. Told pt that it take up to 48-72 hours for refill to go from our office to the pharmacy. Pt understood still wanted to have a message put in. Please advise.  chlorthalidone (HYGROTON) 25 MG tablet [[id number]]   metoprolol succinate (TOPROL-XL) 50 MG 24 hr tablet [[id number]]

## 2019-12-08 ENCOUNTER — Ambulatory Visit: Payer: 59 | Admitting: Family Medicine

## 2019-12-14 ENCOUNTER — Ambulatory Visit: Payer: 59 | Admitting: Family Medicine

## 2019-12-15 ENCOUNTER — Encounter: Payer: Self-pay | Admitting: Family Medicine

## 2020-02-25 ENCOUNTER — Other Ambulatory Visit: Payer: Self-pay | Admitting: Emergency Medicine

## 2020-02-25 ENCOUNTER — Other Ambulatory Visit: Payer: Self-pay | Admitting: Family Medicine

## 2020-02-25 DIAGNOSIS — I10 Essential (primary) hypertension: Secondary | ICD-10-CM

## 2020-02-25 MED ORDER — CHLORTHALIDONE 25 MG PO TABS: ORAL_TABLET | ORAL | 0 refills | Status: DC

## 2020-02-25 NOTE — Telephone Encounter (Signed)
Spoke with patient and scheduled appt for Monday 03/13/20 for med refills.

## 2020-02-25 NOTE — Telephone Encounter (Signed)
Requested medications are due for refill today yes  Requested medications are on the active medication list yes  Last refill 11/15  Last visit 11/2018  Future visit scheduled no  Notes to clinic Failed protocol due to no valid visit within 6  months.

## 2020-02-25 NOTE — Telephone Encounter (Signed)
Please schedule appt for refills. Courtsey refill was sent 2 month ago. No further refills without appt. Refill denied!

## 2020-03-13 ENCOUNTER — Ambulatory Visit (INDEPENDENT_AMBULATORY_CARE_PROVIDER_SITE_OTHER): Payer: 59 | Admitting: Family Medicine

## 2020-03-13 ENCOUNTER — Other Ambulatory Visit: Payer: Self-pay

## 2020-03-13 ENCOUNTER — Encounter: Payer: Self-pay | Admitting: Family Medicine

## 2020-03-13 VITALS — BP 150/110 | HR 69 | Temp 97.4°F | Ht 63.39 in | Wt 191.0 lb

## 2020-03-13 DIAGNOSIS — R519 Headache, unspecified: Secondary | ICD-10-CM

## 2020-03-13 DIAGNOSIS — I10 Essential (primary) hypertension: Secondary | ICD-10-CM | POA: Diagnosis not present

## 2020-03-13 DIAGNOSIS — Z1231 Encounter for screening mammogram for malignant neoplasm of breast: Secondary | ICD-10-CM | POA: Diagnosis not present

## 2020-03-13 DIAGNOSIS — R110 Nausea: Secondary | ICD-10-CM | POA: Diagnosis not present

## 2020-03-13 DIAGNOSIS — E66813 Obesity, class 3: Secondary | ICD-10-CM | POA: Insufficient documentation

## 2020-03-13 HISTORY — DX: Morbid (severe) obesity due to excess calories: E66.01

## 2020-03-13 MED ORDER — IBUPROFEN 800 MG PO TABS: 800.0000 mg | ORAL_TABLET | Freq: Three times a day (TID) | ORAL | 0 refills | Status: DC | PRN

## 2020-03-13 MED ORDER — CHLORTHALIDONE 25 MG PO TABS: ORAL_TABLET | ORAL | 3 refills | Status: DC

## 2020-03-13 MED ORDER — SUMATRIPTAN SUCCINATE 25 MG PO TABS: 25.0000 mg | ORAL_TABLET | ORAL | 0 refills | Status: DC | PRN

## 2020-03-13 MED ORDER — ONDANSETRON HCL 4 MG PO TABS: 4.0000 mg | ORAL_TABLET | Freq: Three times a day (TID) | ORAL | 0 refills | Status: DC | PRN

## 2020-03-13 MED ORDER — METOPROLOL SUCCINATE ER 50 MG PO TB24: 75.0000 mg | ORAL_TABLET | Freq: Every day | ORAL | 3 refills | Status: DC

## 2020-03-13 NOTE — Patient Instructions (Addendum)
Take BP daily at home for goal< 130/80  Start taking 1.5 tabs of metoprolol   Hypertension, Adult Hypertension is another name for high blood pressure. High blood pressure forces your heart to work harder to pump blood. This can cause problems over time. There are two numbers in a blood pressure reading. There is a top number (systolic) over a bottom number (diastolic). It is best to have a blood pressure that is below 120/80. Healthy choices can help lower your blood pressure, or you may need medicine to help lower it. What are the causes? The cause of this condition is not known. Some conditions may be related to high blood pressure. What increases the risk?  Smoking.  Having type 2 diabetes mellitus, high cholesterol, or both.  Not getting enough exercise or physical activity.  Being overweight.  Having too much fat, sugar, calories, or salt (sodium) in your diet.  Drinking too much alcohol.  Having long-term (chronic) kidney disease.  Having a family history of high blood pressure.  Age. Risk increases with age.  Race. You may be at higher risk if you are African American.  Gender. Men are at higher risk than women before age 61. After age 61, women are at higher risk than men.  Having obstructive sleep apnea.  Stress. What are the signs or symptoms?  High blood pressure may not cause symptoms. Very high blood pressure (hypertensive crisis) may cause: ? Headache. ? Feelings of worry or nervousness (anxiety). ? Shortness of breath. ? Nosebleed. ? A feeling of being sick to your stomach (nausea). ? Throwing up (vomiting). ? Changes in how you see. ? Very bad chest pain. ? Seizures. How is this treated?  This condition is treated by making healthy lifestyle changes, such as: ? Eating healthy foods. ? Exercising more. ? Drinking less alcohol.  Your health care provider may prescribe medicine if lifestyle changes are not enough to get your blood pressure under  control, and if: ? Your top number is above 130. ? Your bottom number is above 80.  Your personal target blood pressure may vary. Follow these instructions at home: Eating and drinking  If told, follow the DASH eating plan. To follow this plan: ? Fill one half of your plate at each meal with fruits and vegetables. ? Fill one fourth of your plate at each meal with whole grains. Whole grains include whole-wheat pasta, brown rice, and whole-grain bread. ? Eat or drink low-fat dairy products, such as skim milk or low-fat yogurt. ? Fill one fourth of your plate at each meal with low-fat (lean) proteins. Low-fat proteins include fish, chicken without skin, eggs, beans, and tofu. ? Avoid fatty meat, cured and processed meat, or chicken with skin. ? Avoid pre-made or processed food.  Eat less than 1,500 mg of salt each day.  Do not drink alcohol if: ? Your doctor tells you not to drink. ? You are pregnant, may be pregnant, or are planning to become pregnant.  If you drink alcohol: ? Limit how much you use to:  0-1 drink a day for women.  0-2 drinks a day for men. ? Be aware of how much alcohol is in your drink. In the [place] one drink equals one 12 oz bottle of beer (355 mL), one 5 oz glass of wine (148 mL), or one 1 oz glass of hard liquor (44 mL).   Lifestyle  Work with your doctor to stay at a healthy weight or to lose weight. Ask your  doctor what the best weight is for you.  Get at least 30 minutes of exercise most days of the week. This may include walking, swimming, or biking.  Get at least 30 minutes of exercise that strengthens your muscles (resistance exercise) at least 3 days a week. This may include lifting weights or doing Pilates.  Do not use any products that contain nicotine or tobacco, such as cigarettes, e-cigarettes, and chewing tobacco. If you need help quitting, ask your doctor.  Check your blood pressure at home as told by your doctor.  Keep all follow-up visits  as told by your doctor. This is important.   Medicines  Take over-the-counter and prescription medicines only as told by your doctor. Follow directions carefully.  Do not skip doses of blood pressure medicine. The medicine does not work as well if you skip doses. Skipping doses also puts you at risk for problems.  Ask your doctor about side effects or reactions to medicines that you should watch for. Contact a doctor if you:  Think you are having a reaction to the medicine you are taking.  Have headaches that keep coming back (recurring).  Feel dizzy.  Have swelling in your ankles.  Have trouble with your vision. Get help right away if you:  Get a very bad headache.  Start to feel mixed up (confused).  Feel weak or numb.  Feel faint.  Have very bad pain in your: ? Chest. ? Belly (abdomen).  Throw up more than once.  Have trouble breathing. Summary  Hypertension is another name for high blood pressure.  High blood pressure forces your heart to work harder to pump blood.  For most people, a normal blood pressure is less than 120/80.  Making healthy choices can help lower blood pressure. If your blood pressure does not get lower with healthy choices, you may need to take medicine. This information is not intended to replace advice given to you by your health care provider. Make sure you discuss any questions you have with your health care provider. Document Revised: 09/17/2017 Document Reviewed: 09/17/2017 Elsevier Patient Education  2021 [employer].   If you have lab work done today you will be contacted with your lab results within the next 2 weeks.  If you have not heard from [place] then please contact us. The fastest way to get your results is to register for My Chart.   IF you received an x-ray today, you will receive an invoice from [facility]. Please contact [facility] at [phone] with questions or concerns regarding your invoice.   IF  you received labwork today, you will receive an invoice from [place]. Please contact [facility] at [phone] with questions or concerns regarding your invoice.   Our billing staff will not be able to assist you with questions regarding bills from these companies.  You will be contacted with the lab results as soon as they are available. The fastest way to get your results is to activate your My Chart account. Instructions are located on the last page of this paperwork. If you have not heard from [place] regarding the results in 2 weeks, please contact this office.

## 2020-03-13 NOTE — Progress Notes (Signed)
2/21/202212:20 PM  Sally Rivera [birth year], 50 y.o., female [id number]  Chief Complaint  Patient presents with  . Hypertension  . Head Injury    Got hit in head with a tote Wednesday last week while at work , has had nausea all weekend     HPI:   Patient is a 50 y.o. female with past medical history significant for HTN who presents today for medication refills.  Was Wed accidentally hit herself in the head with a tote at work Denies loc Next day was dizzy The next day had pain upper shoulders Friday AM had severe headache Was taking motrin for pain Out of work [employer] and Fri Worked this weekend took motrin 858m q12 hours Still has a slight headache Ate this morning Does have a history of migraines  HTN Chlorthalidone 25 mg Metoprolol 25 mg daily Doesn't check BP at home Previously tried Lisinopril and Amlodipine and Losartan Lisinopril: swelling Amlodipine: muscle aches Losartan: dizziness BP not at goal< 130/80  BP Readings from Last 3 Encounters:  03/13/20 (!) 150/110  12/07/18 (!) 148/90  06/29/18 132/84    Depression screen PHQ 2/9 03/13/2020 12/07/2018 05/18/2018  Decreased Interest 0 0 0  Down, Depressed, Hopeless 0 0 0  PHQ - 2 Score 0 0 0    Fall Risk  03/13/2020 12/07/2018 06/29/2018 05/18/2018 05/15/2018  Falls in the past year? 0 0 0 0 0  Number falls in past yr: 0 0 0 - 0  Injury with Fall? 0 0 0 0 0  Follow up Falls evaluation completed - - - Falls evaluation completed     Allergies  Allergen Reactions  . Lisinopril Swelling  . Soma [Carisoprodol] Swelling  . Amlodipine Other (See Comments)    Muscle pain    Prior to Admission medications   Medication Sig Start Date End Date Taking? Authorizing Provider  Blood Pressure Monitoring (B-D ASSURE BPM/MANUAL ARM CUFF) MISC 1 Device by Does not apply route daily. 04/18/16   Weber, SDamaris Hippo PA-C  chlorthalidone (HYGROTON) 25 MG tablet TAKE 1 TABLET(25 MG) BY MOUTH DAILY 02/25/20   Mikiah Demond, KLaurita Quint FNP   cyclobenzaprine (FLEXERIL) 5 MG tablet TAKE 1 TABLET BY MOUTH THREE TIMES DAILY AS NEEDED FOR MUSCLE SPASMS 09/23/17   Weber, SDamaris Hippo PA-C  hydrocortisone 2.5 % cream Apply topically 2 (two) times daily. 06/13/17   Weber, SDamaris Hippo PA-C  ibuprofen (ADVIL) 800 MG tablet Take 1 tablet (800 mg total) by mouth every 8 (eight) hours as needed. 06/29/18   SJacelyn Pi ILilia Argue MD  metoprolol succinate (TOPROL-XL) 50 MG 24 hr tablet TAKE 1 TABLET BY MOUTH DAILY. TAKE WITH OR IMMEDIATELY FOLLOWING A MEAL 12/06/19   Jordin Vicencio, KLaurita Quint FNP  Probiotic Product (PROBIOTIC PO) Take 1 tablet by mouth daily.    [provider]  lisinopril-hydrochlorothiazide (PRINZIDE,ZESTORETIC) 20-25 MG tablet Take 1 tablet by mouth daily. 10/17/14 12/14/14  [provider]    Past Medical History:  Diagnosis Date  . Allergy   . Hypertension   . Sickle cell anemia (HCC)    carries the traIT    History reviewed. No pertinent surgical history.  Social History   Tobacco Use  . Smoking status: Former SResearch scientist (life sciences) . Smokeless tobacco: Never Used  Substance Use Topics  . Alcohol use: Yes    Comment: occ    History reviewed. No pertinent family history.  Review of Systems  Constitutional: Negative for chills, fever and malaise/fatigue.  Eyes: Negative for blurred vision  and double vision.  Respiratory: Negative for cough, shortness of breath and wheezing.   Cardiovascular: Negative for chest pain, palpitations and leg swelling.  Gastrointestinal: Positive for nausea. Negative for abdominal pain, blood in stool, constipation, diarrhea, heartburn and vomiting.  Genitourinary: Negative for dysuria, frequency and hematuria.  Musculoskeletal: Negative for back pain and joint pain.  Skin: Negative for rash.  Neurological: Positive for headaches. Negative for dizziness and weakness.     OBJECTIVE:  Today's Vitals   03/13/20 1110 03/13/20 1149  BP: (!) 176/112 (!) 150/110  Pulse: 69   Temp: (!) 97.4 F  (36.3 C)   SpO2: 100%   Weight: 191 lb (86.6 kg)   Height: 5' 3.39" (1.61 m)    Body mass index is 33.42 kg/m.   Physical Exam Constitutional:      General: She is not in acute distress.    Appearance: Normal appearance. She is not ill-appearing.  HENT:     Head: Normocephalic.  Eyes:     Extraocular Movements: Extraocular movements intact.     Pupils: Pupils are equal, round, and reactive to light.  Cardiovascular:     Rate and Rhythm: Normal rate and regular rhythm.     Pulses: Normal pulses.     Heart sounds: Normal heart sounds. No murmur heard. No friction rub. No gallop.   Pulmonary:     Effort: Pulmonary effort is normal. No respiratory distress.     Breath sounds: Normal breath sounds. No stridor. No wheezing, rhonchi or rales.  Abdominal:     General: Bowel sounds are normal. There is no distension.     Palpations: Abdomen is soft. There is no mass.     Tenderness: There is no abdominal tenderness. There is no right CVA tenderness, left CVA tenderness or guarding.  Musculoskeletal:     Right lower leg: No edema.     Left lower leg: No edema.  Skin:    General: Skin is warm and dry.  Neurological:     General: No focal deficit present.     Mental Status: She is alert and oriented to person, place, and time. Mental status is at baseline.     Sensory: No sensory deficit.     Motor: No weakness.     Coordination: Coordination normal.     Gait: Gait normal.  Psychiatric:        Mood and Affect: Mood normal.        Behavior: Behavior normal.     No results found for this or any previous visit (from the past 24 hour(s)).  No results found.   ASSESSMENT and PLAN  Problem List Items Addressed This Visit      Other   Obesity, Class III, BMI 40-49.9 (morbid obesity) ([place]) - Primary   Relevant Orders   Lipid Panel    Other Visit Diagnoses    Essential hypertension       Relevant Medications   metoprolol succinate (TOPROL-XL) 50 MG 24 hr tablet    chlorthalidone (HYGROTON) 25 MG tablet   Other Relevant Orders   CMP14+EGFR   Encounter for screening mammogram for malignant neoplasm of breast       Relevant Orders   MM DIGITAL SCREENING BILATERAL   Nausea without vomiting       Relevant Medications   ondansetron (ZOFRAN) 4 MG tablet   Generalized headaches       Relevant Medications   metoprolol succinate (TOPROL-XL) 50 MG 24 hr tablet   ibuprofen (ADVIL)  800 MG tablet   SUMAtriptan (IMITREX) 25 MG tablet      Plan . Is checking with insurance will follow up with colon ca screening . Mammogram ordered . Medication refills sent . Increased metoprolol from 50 to 75 for BP goal< 130/80 . RTC/ED precautions provided . Encouraged to take BP daily at home   Return in about 2 weeks (around 03/27/2020).    Huston Foley Sherise Geerdes, FNP-BC [facility] at [place], [place] [address] Ph.  [phone] Fax [phone]

## 2020-03-14 ENCOUNTER — Encounter: Payer: Self-pay | Admitting: Family Medicine

## 2020-03-14 ENCOUNTER — Other Ambulatory Visit: Payer: Self-pay | Admitting: Family Medicine

## 2020-03-14 DIAGNOSIS — E782 Mixed hyperlipidemia: Secondary | ICD-10-CM

## 2020-03-14 DIAGNOSIS — E876 Hypokalemia: Secondary | ICD-10-CM

## 2020-03-14 LAB — LIPID PANEL
Chol/HDL Ratio: 4.6 ratio — ABNORMAL HIGH (ref 0.0–4.4)
Cholesterol, Total: 171 mg/dL (ref 100–199)
HDL: 37 mg/dL — ABNORMAL LOW (ref >39)
LDL Chol Calc (NIH): 105 mg/dL — ABNORMAL HIGH (ref 0–99)
Triglycerides: 163 mg/dL — ABNORMAL HIGH (ref 0–149)
VLDL Cholesterol Cal: 29 mg/dL (ref 5–40)

## 2020-03-14 LAB — CMP14+EGFR
ALT: 20 IU/L (ref 0–32)
AST: 20 IU/L (ref 0–40)
Albumin/Globulin Ratio: 1.6 (ref 1.2–2.2)
Albumin: 4.6 g/dL (ref 3.8–4.8)
Alkaline Phosphatase: 74 IU/L (ref 44–121)
BUN/Creatinine Ratio: 13 (ref 9–23)
BUN: 13 mg/dL (ref 6–24)
Bilirubin Total: 0.5 mg/dL (ref 0.0–1.2)
CO2: 25 mmol/L (ref 20–29)
Calcium: 10.3 mg/dL — ABNORMAL HIGH (ref 8.7–10.2)
Chloride: 96 mmol/L (ref 96–106)
Creatinine, Ser: 0.97 mg/dL (ref 0.57–1.00)
GFR calc Af Amer: 79 mL/min/{1.73_m2} (ref >59)
GFR calc non Af Amer: 68 mL/min/{1.73_m2} (ref >59)
Globulin, Total: 2.8 g/dL (ref 1.5–4.5)
Glucose: 101 mg/dL — ABNORMAL HIGH (ref 65–99)
Potassium: 3.1 mmol/L — ABNORMAL LOW (ref 3.5–5.2)
Sodium: 137 mmol/L (ref 134–144)
Total Protein: 7.4 g/dL (ref 6.0–8.5)

## 2020-03-14 MED ORDER — POTASSIUM CHLORIDE CRYS ER 20 MEQ PO TBCR: 20.0000 meq | EXTENDED_RELEASE_TABLET | Freq: Every day | ORAL | 3 refills | Status: DC

## 2020-03-14 MED ORDER — ROSUVASTATIN CALCIUM 10 MG PO TABS: 10.0000 mg | ORAL_TABLET | Freq: Every day | ORAL | 3 refills | Status: DC

## 2020-03-14 NOTE — Progress Notes (Signed)
Overall labs look good. Your potassium is low due to your BP medications. I have ordered daily potassium to your pharmacy for you to start taking. Your cholesterol is also quite elevated. I have sent a medication called crestor to your pharmacy for you to start. Let me know if you have any questions or would like a referral to a dietician to discuss further.

## 2020-03-27 ENCOUNTER — Ambulatory Visit: Payer: 59 | Admitting: Family Medicine

## 2020-03-29 ENCOUNTER — Other Ambulatory Visit: Payer: Self-pay

## 2020-03-29 ENCOUNTER — Ambulatory Visit (INDEPENDENT_AMBULATORY_CARE_PROVIDER_SITE_OTHER): Payer: 59 | Admitting: Family Medicine

## 2020-03-29 ENCOUNTER — Encounter: Payer: Self-pay | Admitting: Family Medicine

## 2020-03-29 VITALS — BP 138/86 | HR 62 | Temp 97.6°F | Ht 63.39 in | Wt 197.0 lb

## 2020-03-29 DIAGNOSIS — I10 Essential (primary) hypertension: Secondary | ICD-10-CM | POA: Diagnosis not present

## 2020-03-29 DIAGNOSIS — R519 Headache, unspecified: Secondary | ICD-10-CM | POA: Diagnosis not present

## 2020-03-29 DIAGNOSIS — R110 Nausea: Secondary | ICD-10-CM | POA: Diagnosis not present

## 2020-03-29 DIAGNOSIS — L299 Pruritus, unspecified: Secondary | ICD-10-CM

## 2020-03-29 MED ORDER — METOPROLOL SUCCINATE ER 100 MG PO TB24: 100.0000 mg | ORAL_TABLET | Freq: Every day | ORAL | 3 refills | Status: DC

## 2020-03-29 MED ORDER — IBUPROFEN 800 MG PO TABS: 800.0000 mg | ORAL_TABLET | Freq: Three times a day (TID) | ORAL | 1 refills | Status: DC | PRN

## 2020-03-29 MED ORDER — HYDROCORTISONE 2.5 % EX CREA
TOPICAL_CREAM | Freq: Two times a day (BID) | CUTANEOUS | 2 refills | Status: DC
Start: 2020-03-29 — End: 2020-12-18

## 2020-03-29 NOTE — Patient Instructions (Addendum)
  Hypertension, Adult Hypertension is another name for high blood pressure. High blood pressure forces your heart to work harder to pump blood. This can cause problems over time. There are two numbers in a blood pressure reading. There is a top number (systolic) over a bottom number (diastolic). It is best to have a blood pressure that is below 120/80. Healthy choices can help lower your blood pressure, or you may need medicine to help lower it. What are the causes? The cause of this condition is not known. Some conditions may be related to high blood pressure. What increases the risk?  Smoking.  Having type 2 diabetes mellitus, high cholesterol, or both.  Not getting enough exercise or physical activity.  Being overweight.  Having too much fat, sugar, calories, or salt (sodium) in your diet.  Drinking too much alcohol.  Having long-term (chronic) kidney disease.  Having a family history of high blood pressure.  Age. Risk increases with age.  Race. You may be at higher risk if you are African American.  Gender. Men are at higher risk than women before age 45. After age 65, women are at higher risk than men.  Having obstructive sleep apnea.  Stress. What are the signs or symptoms?  High blood pressure may not cause symptoms. Very high blood pressure (hypertensive crisis) may cause: ? Headache. ? Feelings of worry or nervousness (anxiety). ? Shortness of breath. ? Nosebleed. ? A feeling of being sick to your stomach (nausea). ? Throwing up (vomiting). ? Changes in how you see. ? Very bad chest pain. ? Seizures. How is this treated?  This condition is treated by making healthy lifestyle changes, such as: ? Eating healthy foods. ? Exercising more. ? Drinking less alcohol.  Your health care provider may prescribe medicine if lifestyle changes are not enough to get your blood pressure under control, and if: ? Your top number is above 130. ? Your bottom number is  above 80.  Your personal target blood pressure may vary. Follow these instructions at home: Eating and drinking  If told, follow the DASH eating plan. To follow this plan: ? Fill one half of your plate at each meal with fruits and vegetables. ? Fill one fourth of your plate at each meal with whole grains. Whole grains include whole-wheat pasta, brown rice, and whole-grain bread. ? Eat or drink low-fat dairy products, such as skim milk or low-fat yogurt. ? Fill one fourth of your plate at each meal with low-fat (lean) proteins. Low-fat proteins include fish, chicken without skin, eggs, beans, and tofu. ? Avoid fatty meat, cured and processed meat, or chicken with skin. ? Avoid pre-made or processed food.  Eat less than 1,500 mg of salt each day.  Do not drink alcohol if: ? Your doctor tells you not to drink. ? You are pregnant, may be pregnant, or are planning to become pregnant.  If you drink alcohol: ? Limit how much you use to:  0-1 drink a day for women.  0-2 drinks a day for men. ? Be aware of how much alcohol is in your drink. In the [place] one drink equals one 12 oz bottle of beer (355 mL), one 5 oz glass of wine (148 mL), or one 1 oz glass of hard liquor (44 mL).   Lifestyle  Work with your doctor to stay at a healthy weight or to lose weight. Ask your doctor what the best weight is for you.  Get at least 30 minutes of   exercise most days of the week. This may include walking, swimming, or biking.  Get at least 30 minutes of exercise that strengthens your muscles (resistance exercise) at least 3 days a week. This may include lifting weights or doing Pilates.  Do not use any products that contain nicotine or tobacco, such as cigarettes, e-cigarettes, and chewing tobacco. If you need help quitting, ask your doctor.  Check your blood pressure at home as told by your doctor.  Keep all follow-up visits as told by your doctor. This is important.   Medicines  Take  over-the-counter and prescription medicines only as told by your doctor. Follow directions carefully.  Do not skip doses of blood pressure medicine. The medicine does not work as well if you skip doses. Skipping doses also puts you at risk for problems.  Ask your doctor about side effects or reactions to medicines that you should watch for. Contact a doctor if you:  Think you are having a reaction to the medicine you are taking.  Have headaches that keep coming back (recurring).  Feel dizzy.  Have swelling in your ankles.  Have trouble with your vision. Get help right away if you:  Get a very bad headache.  Start to feel mixed up (confused).  Feel weak or numb.  Feel faint.  Have very bad pain in your: ? Chest. ? Belly (abdomen).  Throw up more than once.  Have trouble breathing. Summary  Hypertension is another name for high blood pressure.  High blood pressure forces your heart to work harder to pump blood.  For most people, a normal blood pressure is less than 120/80.  Making healthy choices can help lower blood pressure. If your blood pressure does not get lower with healthy choices, you may need to take medicine. This information is not intended to replace advice given to you by your health care provider. Make sure you discuss any questions you have with your health care provider. Document Revised: 09/17/2017 Document Reviewed: 09/17/2017 Elsevier Patient Education  2021 [employer]   If you have lab work done today you will be contacted with your lab results within the next 2 weeks.  If you have not heard from us then please contact us. The fastest way to get your results is to register for My Chart.   IF you received an x-ray today, you will receive an invoice from [facility]. Please contact [facility] at [phone] with questions or concerns regarding your invoice.   IF you received labwork today, you will receive an invoice from  [facility]. Please contact [facility] at [phone] with questions or concerns regarding your invoice.   Our billing staff will not be able to assist you with questions regarding bills from these companies.  You will be contacted with the lab results as soon as they are available. The fastest way to get your results is to activate your My Chart account. Instructions are located on the last page of this paperwork. If you have not heard from us regarding the results in 2 weeks, please contact this office.      
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
## 2020-07-26 ENCOUNTER — Other Ambulatory Visit: Payer: Self-pay | Admitting: Family Medicine

## 2020-07-26 DIAGNOSIS — Z1231 Encounter for screening mammogram for malignant neoplasm of breast: Secondary | ICD-10-CM

## 2020-09-18 ENCOUNTER — Other Ambulatory Visit: Payer: Self-pay

## 2020-09-18 ENCOUNTER — Ambulatory Visit
Admission: RE | Admit: 2020-09-18 | Discharge: 2020-09-18 | Disposition: A | Discharge status: Home / Self Care | Payer: 59 | Source: Ambulatory Visit

## 2020-09-18 DIAGNOSIS — Z1231 Encounter for screening mammogram for malignant neoplasm of breast: Secondary | ICD-10-CM

## 2020-09-26 ENCOUNTER — Other Ambulatory Visit: Payer: Self-pay | Admitting: Family Medicine

## 2020-09-26 DIAGNOSIS — R928 Other abnormal and inconclusive findings on diagnostic imaging of breast: Secondary | ICD-10-CM

## 2020-09-26 DIAGNOSIS — N6310 Unspecified lump in the right breast, unspecified quadrant: Secondary | ICD-10-CM

## 2020-10-06 ENCOUNTER — Other Ambulatory Visit: Payer: Self-pay | Admitting: Registered Nurse

## 2020-10-07 ENCOUNTER — Other Ambulatory Visit: Payer: 59

## 2020-12-18 ENCOUNTER — Encounter (HOSPITAL_BASED_OUTPATIENT_CLINIC_OR_DEPARTMENT_OTHER): Payer: Self-pay | Admitting: Nurse Practitioner

## 2020-12-18 ENCOUNTER — Ambulatory Visit (INDEPENDENT_AMBULATORY_CARE_PROVIDER_SITE_OTHER): Payer: 59 | Admitting: Nurse Practitioner

## 2020-12-18 ENCOUNTER — Encounter (HOSPITAL_BASED_OUTPATIENT_CLINIC_OR_DEPARTMENT_OTHER): Payer: Self-pay

## 2020-12-18 ENCOUNTER — Other Ambulatory Visit: Payer: Self-pay

## 2020-12-18 VITALS — BP 140/100 | HR 97 | Ht 63.0 in | Wt 198.0 lb

## 2020-12-18 DIAGNOSIS — Z0000 Encounter for general adult medical examination without abnormal findings: Secondary | ICD-10-CM | POA: Diagnosis not present

## 2020-12-18 DIAGNOSIS — F4310 Post-traumatic stress disorder, unspecified: Secondary | ICD-10-CM

## 2020-12-18 DIAGNOSIS — E782 Mixed hyperlipidemia: Secondary | ICD-10-CM | POA: Diagnosis not present

## 2020-12-18 DIAGNOSIS — R519 Headache, unspecified: Secondary | ICD-10-CM

## 2020-12-18 DIAGNOSIS — E876 Hypokalemia: Secondary | ICD-10-CM

## 2020-12-18 DIAGNOSIS — I10 Essential (primary) hypertension: Secondary | ICD-10-CM | POA: Diagnosis not present

## 2020-12-18 DIAGNOSIS — N6310 Unspecified lump in the right breast, unspecified quadrant: Secondary | ICD-10-CM

## 2020-12-18 MED ORDER — ROSUVASTATIN CALCIUM 10 MG PO TABS: 10.0000 mg | ORAL_TABLET | Freq: Every day | ORAL | 3 refills | Status: DC

## 2020-12-18 MED ORDER — TRAZODONE HCL 50 MG PO TABS: 50.0000 mg | ORAL_TABLET | Freq: Every day | ORAL | 3 refills | Status: DC

## 2020-12-18 MED ORDER — METOPROLOL SUCCINATE ER 100 MG PO TB24: 100.0000 mg | ORAL_TABLET | Freq: Every day | ORAL | 3 refills | Status: DC

## 2020-12-18 MED ORDER — ESCITALOPRAM OXALATE 10 MG PO TABS: ORAL_TABLET | ORAL | 3 refills | Status: DC

## 2020-12-18 MED ORDER — SUMATRIPTAN SUCCINATE 25 MG PO TABS: 25.0000 mg | ORAL_TABLET | ORAL | 3 refills | Status: AC | PRN

## 2020-12-18 MED ORDER — CHLORTHALIDONE 25 MG PO TABS: ORAL_TABLET | ORAL | 3 refills | Status: DC

## 2020-12-18 NOTE — Assessment & Plan Note (Signed)
Review of current and past medical history, social history, medication, and family history.  Review of care gaps and health maintenance recommendations.  Records from recent providers to be requested if not available in Chart Review or Care Everywhere.  Recommendations for health maintenance, diet, and exercise provided.  Labs today: None HM Recommendations: Will review records for recommendations CPE due: Will review records for recommendations

## 2020-12-18 NOTE — Assessment & Plan Note (Signed)
Chronic due to chlorthalidone Replacement in place Recommend recheck in 3-6 months with other labs
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
## 2020-12-18 NOTE — Assessment & Plan Note (Signed)
Diagnostic mammo and [place] ordered today

## 2020-12-18 NOTE — Assessment & Plan Note (Signed)
Refill on statin today Monitor diet - recommend no more than 13g saturated fat daily and increase fiber to at least 30g daily Will need labs in next 3-6 months
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
## 2020-12-18 NOTE — Patient Instructions (Signed)
Thank you for choosing [place] at [facility] for your Primary Care needs. I am excited for the opportunity to partner with you to meet your health care goals. It was a pleasure meeting you today!  Recommendations from today's visit: I have written you out of work for the next 4 weeks for now. If they provide you with FMLA paperwork, you can bring that in or they may send it directly to me. I will recommend at least 6 weeks with FMLA, but we can address and add or take away time as needed.  I have sent in lexapro (escitalopram) 59m to be taken at bedtime. Start with 1/2 tab for 4 days then increase to full dose.  I have sent trazodone for sleep. Let me know if this is not working well for you.  I have refilled your other medications.  I would like to check back in with you in 2 weeks with virtual visit to see how you are doing.  If you have not heard from the counseling center by Wednesday, please let me know.  If at any point you feel that you need urgent mental health assistance, go to the emergency room or the behavioral health urgent care at [address] [facility] Their number is [phone] and they are open 24 hours  Information on diet, exercise, and health maintenance recommendations are listed below. This is information to help you be sure you are on track for optimal health and monitoring.   Please look over this and let [place] if you have any questions or if you have completed any of the health maintenance outside of [place] [place] that we can be sure your records are up to date.  ___________________________________________________________ About Me: I am an Adult-Geriatric Nurse Practitioner with a background in caring for patients for more than 20 years with a strong intensive care background. I provide primary care and sports medicine services to patients age 649and older within this office. My education had a strong focus on caring for the older adult  population, which I am passionate about. I am also the director of the APP Fellowship with [facility]   My desire is to provide you with the best service through preventive medicine and supportive care. I consider you a part of the medical team and value your input. I work diligently to ensure that you are heard and your needs are met in a safe and effective manner. I want you to feel comfortable with me as your provider and want you to know that your health concerns are important to me.  For your information, our office hours are: Monday, Tuesday, and Thursday 8:00 AM - 5:00 PM Wednesday and Friday 8:00 AM - 12:00 PM.   In my time away from the office I am teaching new APP's within the system and am unavailable, but my partner, Dr. dBurnard Buntingis in the office for emergent needs.   If you have questions or concerns, please call our office at [phone] send [facility]aa MyChart message and we will respond as quickly as possible.  ____________________________________________________________ MyChart:  For all urgent or time sensitive needs we ask that you please call the office to avoid delays. Our number is [phone]. MyChart is not constantly monitored and due to the large volume of messages a day, replies may take up to 72 business hours.  MyChart Policy: MyChart allows for you to see your visit notes, after visit summary, provider recommendations,  lab and tests results, make an appointment, request refills, and contact your provider or the office for non-urgent questions or concerns. Providers are seeing patients during normal business hours and do not have built in time to review MyChart messages.  We ask that you allow a minimum of 3 business days for responses to [employer]. For this reason, please do not send urgent requests through [place]. Please call the office at [phone]. New and ongoing conditions may require a visit. We have virtual and in person visit available for your  convenience.  Complex MyChart concerns may require a visit. Your provider may request you schedule a virtual or in person visit to ensure we are providing the best care possible. MyChart messages sent after 11:00 AM on Friday will not be received by the provider until Monday morning.    Lab and Test Results: You will receive your lab and test results on MyChart as soon as they are completed and results have been sent by the lab or testing facility. Due to this service, you will receive your results BEFORE your provider.  I review lab and tests results each morning prior to seeing patients. Some results require collaboration with other providers to ensure you are receiving the most appropriate care. For this reason, we ask that you please allow a minimum of 3-5 business days from the time the ALL results have been received for your provider to receive and review lab and test results and contact you about these.  Most lab and test result comments from the provider will be sent through [place]. Your provider may recommend changes to the plan of care, follow-up visits, repeat testing, ask questions, or request an office visit to discuss these results. You may reply directly to this message or call the office at [phone] to provide information for the provider or set up an appointment. In some instances, you will be called with test results and recommendations. Please let us know if this is preferred and we will make note of this in your chart to provide this for you.    If you have not heard a response to your lab or test results in 5 business days from all results returning to [place], please call the office to let us know. We ask that you please avoid calling prior to this time unless there is an emergent concern. Due to high call volumes, this can delay the resulting process.  After Hours: For all non-emergency after hours needs, please call the office at [phone] and select the option to reach  the on-call provider service. On-call services are shared between multiple [place] offices and therefore it will not be possible to speak directly with your provider. On-call providers may provide medical advice and recommendations, but are unable to provide refills for maintenance medications.  For all emergency or urgent medical needs after normal business hours, we recommend that you seek care at the closest Urgent Care or Emergency Department to ensure appropriate treatment in a timely manner.  [facility] has a 24 hour emergency room located on the ground floor for your convenience.   Urgent Concerns During the Business Day Providers are seeing patients from 8AM to [place] with a busy schedule and are most often not able to respond to non-urgent calls until the end of the day or the next business day. If you should have URGENT concerns during the day, please call and speak to the nurse or schedule a same day appointment so  that we can address your concern without delay.   Thank you, again, for choosing me as your health care partner. I appreciate your trust and look forward to learning more about you.   Sally Keeler, DNP, AGNP-c ___________________________________________________________  Health Maintenance Recommendations Screening Testing Mammogram Every 1 -2 years based on history and risk factors Starting at age 84 Pap Smear Ages 21-39 every 3 years Ages 28-65 every 5 years with HPV testing More frequent testing may be required based on results and history Colon Cancer Screening Every 1-10 years based on test performed, risk factors, and history Starting at age 25 Bone Density Screening Every 2-10 years based on history Starting at age 35 for women Recommendations for men differ based on medication usage, history, and risk factors AAA Screening One time ultrasound Men 102-24 years old who have every smoked Lung Cancer Screening Low Dose Lung CT every  12 months Age 64-80 years with a 30 pack-year smoking history who still smoke or who have quit within the last 15 years  Screening Labs Routine  Labs: Complete Blood Count (CBC), Complete Metabolic Panel (CMP), Cholesterol (Lipid Panel) Every 6-12 months based on history and medications May be recommended more frequently based on current conditions or previous results Hemoglobin A1c Lab Every 3-12 months based on history and previous results Starting at age 25 or earlier with diagnosis of diabetes, high cholesterol, BMI >26, and/or risk factors Frequent monitoring for patients with diabetes to ensure blood sugar control Thyroid Panel (TSH w/ T3 & T4) Every 6 months based on history, symptoms, and risk factors May be repeated more often if on medication HIV One time testing for all patients 46 and older May be repeated more frequently for patients with increased risk factors or exposure Hepatitis C One time testing for all patients 33 and older May be repeated more frequently for patients with increased risk factors or exposure Gonorrhea, Chlamydia Every 12 months for all sexually active persons 13-24 years Additional monitoring may be recommended for those who are considered high risk or who have symptoms PSA Men 45-8 years old with risk factors Additional screening may be recommended from age 21-69 based on risk factors, symptoms, and history  Vaccine Recommendations Tetanus Booster All adults every 10 years Flu Vaccine All patients 6 months and older every year COVID Vaccine All patients 12 years and older Initial dosing with booster May recommend additional booster based on age and health history HPV Vaccine 2 doses all patients age 28-26 Dosing may be considered for patients over 26 Shingles Vaccine (Shingrix) 2 doses all adults 59 years and older Pneumonia (Pneumovax 23) All adults 58 years and older May recommend earlier dosing based on health history Pneumonia  (Prevnar 10) All adults 40 years and older Dosed 1 year after Pneumovax 23  Additional Screening, Testing, and Vaccinations may be recommended on an individualized basis based on family history, health history, risk factors, and/or exposure.  __________________________________________________________  Diet Recommendations for All Patients  I recommend that all patients maintain a diet low in saturated fats, carbohydrates, and cholesterol. While this can be challenging at first, it is not impossible and small changes can make big differences.  Things to try: Decreasing the amount of soda, sweet tea, and/or juice to one or less per day and replace with water While water is always the first choice, if you do not like water you may consider adding a water additive without sugar to improve the taste other sugar free drinks Replace potatoes with  a brightly colored vegetable at dinner Use healthy oils, such as canola oil or olive oil, instead of butter or hard margarine Limit your bread intake to two pieces or less a day Replace regular pasta with low carb pasta options Bake, broil, or grill foods instead of frying Monitor portion sizes  Eat smaller, more frequent meals throughout the day instead of large meals  An important thing to remember is, if you love foods that are not great for your health, you don't have to give them up completely. Instead, allow these foods to be a reward when you have done well. Allowing yourself to still have special treats every once in a while is a nice way to tell yourself thank you for working hard to keep yourself healthy.   Also remember that every day is a new day. If you have a bad day and "fall off the wagon", you can still climb right back up and keep moving along on your journey!  We have resources available to help you!  Some websites that may be helpful  include: www.http://carter.biz/  Www.VeryWellFit.com _____________________________________________________________  Activity Recommendations for All Patients  I recommend that all adults get at least 20 minutes of moderate physical activity that elevates your heart rate at least 5 days out of the week.  Some examples include: Walking or jogging at a pace that allows you to carry on a conversation Cycling (stationary bike or outdoors) Water aerobics Yoga Weight lifting Dancing If physical limitations prevent you from putting stress on your joints, exercise in a pool or seated in a chair are excellent options.  Do determine your MAXIMUM heart rate for activity: YOUR AGE - 220 = MAX HeartRate   Remember! Do not push yourself too hard.  Start slowly and build up your pace, speed, weight, time in exercise, etc.  Allow your body to rest between exercise and get good sleep. You will need more water than normal when you are exerting yourself. Do not wait until you are thirsty to drink. Drink with a purpose of getting in at least 8, 8 ounce glasses of water a day plus more depending on how much you exercise and sweat.    If you begin to develop dizziness, chest pain, abdominal pain, jaw pain, shortness of breath, headache, vision changes, lightheadedness, or other concerning symptoms, stop the activity and allow your body to rest. If your symptoms are severe, seek emergency evaluation immediately. If your symptoms are concerning, but not severe, please let us know so that we can recommend further evaluation.

## 2020-12-18 NOTE — Assessment & Plan Note (Signed)
BP not well controlled at this time- has been out of medication for a month Will send refills today to restart Recommend checking BP at home and notify if pressures are >130/80 with consistent medication use Will need to have labs in the next 3 months

## 2021-01-03 ENCOUNTER — Ambulatory Visit (INDEPENDENT_AMBULATORY_CARE_PROVIDER_SITE_OTHER): Payer: 59 | Admitting: Nurse Practitioner

## 2021-01-03 ENCOUNTER — Other Ambulatory Visit: Payer: Self-pay

## 2021-01-03 ENCOUNTER — Encounter (HOSPITAL_BASED_OUTPATIENT_CLINIC_OR_DEPARTMENT_OTHER): Payer: Self-pay | Admitting: Nurse Practitioner

## 2021-01-03 DIAGNOSIS — F4310 Post-traumatic stress disorder, unspecified: Secondary | ICD-10-CM | POA: Diagnosis not present

## 2021-01-03 NOTE — Assessment & Plan Note (Signed)
Significant improvement of symptoms with addition of lexapro daily and trazodone at night for sleep.  She does have concerns with "mellow" feeling while on the medication, although she is also still having some anxiety symptoms during periods.  Discussed with patient the option to taper to 5mg  lexapro to see if this dose is helpful and stay at the dose for 4 weeks before considering discontinuation. If the symptoms increase with the lower dose, she knows to increase the dose back to 10mg . Given her profession, trust that she can taper the medication with instruction without being seen in the office for the low dose taper.  Will plan to continue trazodone and follow-up in 4 weeks to monitor how she is doing.  If she changes the dosage of medication, she is aware to contact the office and let know.

## 2021-01-03 NOTE — Progress Notes (Signed)
Virtual Visit Encounter telephone visit.   I connected with  Sally Rivera on 01/03/21 at 10:10 AM EST by secure audio and/or video enabled telemedicine application. I verified that I am speaking with the correct person using two identifiers.   I introduced myself as a Publishing rights manager with the practice. The limitations of evaluation and management by telemedicine discussed with the patient and the availability of in person appointments. The patient expressed verbal understanding and consent to proceed.  Participating parties in this visit include: Myself and patient  The patient is: Patient Location: Home I am: Provider Location: Office/Clinic Subjective:    CC and HPI: Sally Rivera is a 50 y.o. year old female presenting for follow up of anxiety/PTSD. At her last visit she was started on lexapro 10mg  daily with trazodone for sleep.  She reports in the last 2 weeks she has been sleeping 5-6h/night. No dreams Decreased appetite remains- only eating about 1 meal a day.  Not as much anxiety when she thinks about what happened, but she did have some panic symptoms when she has to go to the pharmacy.  Able to sit still. Enjoying being with family and friends. She has been out and about and this has been helpful.  She feels like she is able to focus her energy on things other than the event.  She reports that she feels very mellow with the medication and would like to lower the dosage, if possible.  She is still in therapy.   Past medical history, Surgical history, Family history not pertinant except as noted below, Social history, Allergies, and medications have been entered into the medical record, reviewed, and corrections made.   Review of Systems:  All review of systems negative except what is listed in the HPI  Objective:    Alert and oriented x 4 Speaking in clear sentences with no shortness of breath. No distress.  Impression and Recommendations:    Problem List Items  Addressed This Visit     Post-traumatic stress reaction - Primary    Significant improvement of symptoms with addition of lexapro daily and trazodone at night for sleep.  She does have concerns with "mellow" feeling while on the medication, although she is also still having some anxiety symptoms during periods.  Discussed with patient the option to taper to 5mg  lexapro to see if this dose is helpful and stay at the dose for 4 weeks before considering discontinuation. If the symptoms increase with the lower dose, she knows to increase the dose back to 10mg . Given her profession, trust that she can taper the medication with instruction without being seen in the office for the low dose taper.  Will plan to continue trazodone and follow-up in 4 weeks to monitor how she is doing.  If she changes the dosage of medication, she is aware to contact the office and let know.        orders and follow up as documented in EMR I discussed the assessment and treatment plan with the patient. The patient was provided an opportunity to ask questions and all were answered. The patient agreed with the plan and demonstrated an understanding of the instructions.   The patient was advised to call back or seek an in-person evaluation if the symptoms worsen or if the condition fails to improve as anticipated.  Follow-Up: in a month  I provided 18 minutes of non-face-to-face interaction with this non face-to-face encounter including intake, same-day documentation, and chart review.    Felix Pacini, NP , DNP, AGNP-c [facility] at [facility] [phone] (fax)

## 2021-01-18 ENCOUNTER — Ambulatory Visit
Admission: RE | Admit: 2021-01-18 | Discharge: 2021-01-18 | Disposition: A | Discharge status: Home / Self Care | Payer: 59 | Source: Ambulatory Visit | Attending: Nurse Practitioner | Admitting: Nurse Practitioner

## 2021-01-18 ENCOUNTER — Other Ambulatory Visit (HOSPITAL_BASED_OUTPATIENT_CLINIC_OR_DEPARTMENT_OTHER): Payer: Self-pay | Admitting: Nurse Practitioner

## 2021-01-18 DIAGNOSIS — N6310 Unspecified lump in the right breast, unspecified quadrant: Secondary | ICD-10-CM

## 2021-02-01 ENCOUNTER — Other Ambulatory Visit: Payer: 59

## 2021-11-14 ENCOUNTER — Other Ambulatory Visit (HOSPITAL_BASED_OUTPATIENT_CLINIC_OR_DEPARTMENT_OTHER): Payer: Self-pay | Admitting: Nurse Practitioner

## 2021-11-14 DIAGNOSIS — E782 Mixed hyperlipidemia: Secondary | ICD-10-CM

## 2021-11-14 DIAGNOSIS — I10 Essential (primary) hypertension: Secondary | ICD-10-CM

## 2022-01-29 ENCOUNTER — Encounter: Payer: Self-pay | Admitting: Internal Medicine

## 2022-10-29 ENCOUNTER — Telehealth: Payer: Self-pay

## 2022-10-29 NOTE — Telephone Encounter (Signed)
Refill request for chlorthalidone 25 mg

## 2022-10-30 NOTE — Telephone Encounter (Signed)
Called pt, no answer, left voicemail to call back

## 2022-10-31 NOTE — Telephone Encounter (Signed)
Called pt, no answer, left voicemail.

## 2023-01-10 ENCOUNTER — Telehealth: Payer: 59 | Admitting: Family Medicine

## 2023-01-10 DIAGNOSIS — I10 Essential (primary) hypertension: Secondary | ICD-10-CM

## 2023-01-10 DIAGNOSIS — F4310 Post-traumatic stress disorder, unspecified: Secondary | ICD-10-CM

## 2023-01-10 DIAGNOSIS — E782 Mixed hyperlipidemia: Secondary | ICD-10-CM | POA: Diagnosis not present

## 2023-01-10 MED ORDER — CHLORTHALIDONE 25 MG PO TABS: 25.0000 mg | ORAL_TABLET | Freq: Every day | ORAL | 0 refills | Status: DC

## 2023-01-10 MED ORDER — TRAZODONE HCL 50 MG PO TABS: 50.0000 mg | ORAL_TABLET | Freq: Every evening | ORAL | 0 refills | Status: DC | PRN

## 2023-01-10 MED ORDER — METOPROLOL SUCCINATE ER 100 MG PO TB24: 100.0000 mg | ORAL_TABLET | Freq: Every day | ORAL | 0 refills | Status: DC

## 2023-01-10 MED ORDER — ROSUVASTATIN CALCIUM 10 MG PO TABS: 10.0000 mg | ORAL_TABLET | Freq: Every day | ORAL | 0 refills | Status: DC

## 2023-01-10 NOTE — Progress Notes (Signed)
Virtual Visit Consent   Sally Rivera, you are scheduled for a virtual visit with a [facility] provider today. Just as with appointments in the office, your consent must be obtained to participate. Your consent will be active for this visit and any virtual visit you may have with one of our providers in the next 365 days. If you have a MyChart account, a copy of this consent can be sent to you electronically.  As this is a virtual visit, video technology does not allow for your provider to perform a traditional examination. This may limit your provider's ability to fully assess your condition. If your provider identifies any concerns that need to be evaluated in person or the need to arrange testing (such as labs, EKG, etc.), we will make arrangements to do so. Although advances in technology are sophisticated, we cannot ensure that it will always work on either your end or our end. If the connection with a video visit is poor, the visit may have to be switched to a telephone visit. With either a video or telephone visit, we are not always able to ensure that we have a secure connection.  By engaging in this virtual visit, you consent to the provision of healthcare and authorize for your insurance to be billed (if applicable) for the services provided during this visit. Depending on your insurance coverage, you may receive a charge related to this service.  I need to obtain your verbal consent now. Are you willing to proceed with your visit today? Sally Rivera has provided verbal consent on 53/20/2024 for a virtual visit (video or telephone). Sally Curio, Sally Rivera  Date: 01/10/2023 9:04 AM  Virtual Visit via Video Note   I, Sally Rivera, connected with  Sally Rivera  ([id number], [birth year]) on 01/10/23 at  9:00 AM EST by a video-enabled telemedicine application and verified that I am speaking with the correct person using two identifiers.  Location: Patient: Virtual Visit Location Patient:  Home Provider: Virtual Visit Location Provider: Home Office   I discussed the limitations of evaluation and management by telemedicine and the availability of in person appointments. The patient expressed understanding and agreed to proceed.    History of Present Illness: Sally Rivera is a 52 y.o. who identifies as a female who was assigned female at birth, and is being seen today for med refills until she can be seen by pcp. Marland Kitchen  HPI: HPI  Problems:  Patient Active Problem List   Diagnosis Date Noted   Encounter for medical examination to establish care 12/18/2020   Mixed hyperlipidemia 12/18/2020   Hypokalemia 12/18/2020   Mass of right breast 12/18/2020   Post-traumatic stress reaction 12/18/2020   Obesity, Class II, BMI 35-39.9 10/15/2016   HTN (hypertension) 04/18/2016    Allergies:  Allergies  Allergen Reactions   Lisinopril Swelling   Soma [Carisoprodol] Swelling   Amlodipine Other (See Comments)    Muscle pain   Medications:  Current Outpatient Medications:    Blood Pressure Monitoring (B-D ASSURE BPM/MANUAL ARM CUFF) MISC, 1 Device by Does not apply route daily. (Patient not taking: Reported on 12/18/2020), Disp: 1 each, Rfl: 0   chlorthalidone (HYGROTON) 25 MG tablet, TAKE 1 TABLET(25 MG) BY MOUTH DAILY, Disp: 90 tablet, Rfl: 3   escitalopram (LEXAPRO) 10 MG tablet, Take 1/2 tab (5mg ) by mouth at bedtime for 4 days then increase to 1 tab (10mg ) by mouth at bedtime., Disp: 30 tablet, Rfl: 3   metoprolol succinate (TOPROL-XL) 100 MG 24 hr  tablet, TAKE 1 TABLET(100 MG) BY MOUTH DAILY WITH OR IMMEDIATELY FOLLOWING A MEAL, Disp: 90 tablet, Rfl: 3   potassium chloride SA (KLOR-CON) 20 MEQ tablet, Take 1 tablet (20 mEq total) by mouth daily., Disp: 30 tablet, Rfl: 3   rosuvastatin (CRESTOR) 10 MG tablet, TAKE 1 TABLET(10 MG) BY MOUTH DAILY, Disp: 90 tablet, Rfl: 3   SUMAtriptan (IMITREX) 25 MG tablet, Take 1 tablet (25 mg total) by mouth every 2 (two) hours as needed for  migraine. May repeat in 2 hours if headache persists or recurs., Disp: 10 tablet, Rfl: 3   traZODone (DESYREL) 50 MG tablet, Take 1 tablet (50 mg total) by mouth at bedtime., Disp: 90 tablet, Rfl: 3  Observations/Objective: Patient is well-developed, well-nourished in no acute distress.  Resting comfortably  at home.  Head is normocephalic, atraumatic.  No labored breathing.  Speech is clear and coherent with logical content.  Patient is alert and oriented at baseline.    Assessment and Plan: 1. Mixed hyperlipidemia  2. Essential hypertension  3. Post-traumatic stress reaction  Follow up with pcp for further refills.   Follow Up Instructions: I discussed the assessment and treatment plan with the patient. The patient was provided an opportunity to ask questions and all were answered. The patient agreed with the plan and demonstrated an understanding of the instructions.  A copy of instructions were sent to the patient via MyChart unless otherwise noted below.     The patient was advised to call back or seek an in-person evaluation if the symptoms worsen or if the condition fails to improve as anticipated.    Sally Curio, Sally Rivera

## 2023-01-10 NOTE — Patient Instructions (Signed)
Hypertension, Adult High blood pressure (hypertension) is when the force of blood pumping through the arteries is too strong. The arteries are the blood vessels that carry blood from the heart throughout the body. Hypertension forces the heart to work harder to pump blood and may cause arteries to become narrow or stiff. Untreated or uncontrolled hypertension can lead to a heart attack, heart failure, a stroke, kidney disease, and other problems. A blood pressure reading consists of a higher number over a lower number. Ideally, your blood pressure should be below 120/80. The first ("top") number is called the systolic pressure. It is a measure of the pressure in your arteries as your heart beats. The second ("bottom") number is called the diastolic pressure. It is a measure of the pressure in your arteries as the heart relaxes. What are the causes? The exact cause of this condition is not known. There are some conditions that result in high blood pressure. What increases the risk? Certain factors may make you more likely to develop high blood pressure. Some of these risk factors are under your control, including: Smoking. Not getting enough exercise or physical activity. Being overweight. Having too much fat, sugar, calories, or salt (sodium) in your diet. Drinking too much alcohol. Other risk factors include: Having a personal history of heart disease, diabetes, high cholesterol, or kidney disease. Stress. Having a family history of high blood pressure and high cholesterol. Having obstructive sleep apnea. Age. The risk increases with age. What are the signs or symptoms? High blood pressure may not cause symptoms. Very high blood pressure (hypertensive crisis) may cause: Headache. Fast or irregular heartbeats (palpitations). Shortness of breath. Nosebleed. Nausea and vomiting. Vision changes. Severe chest pain, dizziness, and seizures. How is this diagnosed? This condition is diagnosed by  measuring your blood pressure while you are seated, with your arm resting on a flat surface, your legs uncrossed, and your feet flat on the floor. The cuff of the blood pressure monitor will be placed directly against the skin of your upper arm at the level of your heart. Blood pressure should be measured at least twice using the same arm. Certain conditions can cause a difference in blood pressure between your right and left arms. If you have a high blood pressure reading during one visit or you have normal blood pressure with other risk factors, you may be asked to: Return on a different day to have your blood pressure checked again. Monitor your blood pressure at home for 1 week or longer. If you are diagnosed with hypertension, you may have other blood or imaging tests to help your health care provider understand your overall risk for other conditions. How is this treated? This condition is treated by making healthy lifestyle changes, such as eating healthy foods, exercising more, and reducing your alcohol intake. You may be referred for counseling on a healthy diet and physical activity. Your health care provider may prescribe medicine if lifestyle changes are not enough to get your blood pressure under control and if: Your systolic blood pressure is above 130. Your diastolic blood pressure is above 80. Your personal target blood pressure may vary depending on your medical conditions, your age, and other factors. Follow these instructions at home: Eating and drinking  Eat a diet that is high in fiber and potassium, and low in sodium, added sugar, and fat. An example of this eating plan is called the DASH diet. DASH stands for Dietary Approaches to Stop Hypertension. To eat this way: Eat   plenty of fresh fruits and vegetables. Try to fill one half of your plate at each meal with fruits and vegetables. Eat whole grains, such as whole-wheat pasta, brown rice, or whole-grain bread. Fill about one  fourth of your plate with whole grains. Eat or drink low-fat dairy products, such as skim milk or low-fat yogurt. Avoid fatty cuts of meat, processed or cured meats, and poultry with skin. Fill about one fourth of your plate with lean proteins, such as fish, chicken without skin, beans, eggs, or tofu. Avoid pre-made and processed foods. These tend to be higher in sodium, added sugar, and fat. Reduce your daily sodium intake. Many people with hypertension should eat less than 1,500 mg of sodium a day. Do not drink alcohol if: Your health care provider tells you not to drink. You are pregnant, may be pregnant, or are planning to become pregnant. If you drink alcohol: Limit how much you have to: 0-1 drink a day for women. 0-2 drinks a day for men. Know how much alcohol is in your drink. In the [place] one drink equals one 12 oz bottle of beer (355 mL), one 5 oz glass of wine (148 mL), or one 1 oz glass of hard liquor (44 mL). Lifestyle  Work with your health care provider to maintain a healthy body weight or to lose weight. Ask what an ideal weight is for you. Get at least 30 minutes of exercise that causes your heart to beat faster (aerobic exercise) most days of the week. Activities may include walking, swimming, or biking. Include exercise to strengthen your muscles (resistance exercise), such as Pilates or lifting weights, as part of your weekly exercise routine. Try to do these types of exercises for 30 minutes at least 3 days a week. Do not use any products that contain nicotine or tobacco. These products include cigarettes, chewing tobacco, and vaping devices, such as e-cigarettes. If you need help quitting, ask your health care provider. Monitor your blood pressure at home as told by your health care provider. Keep all follow-up visits. This is important. Medicines Take over-the-counter and prescription medicines only as told by your health care provider. Follow directions carefully. Blood  pressure medicines must be taken as prescribed. Do not skip doses of blood pressure medicine. Doing this puts you at risk for problems and can make the medicine less effective. Ask your health care provider about side effects or reactions to medicines that you should watch for. Contact a health care provider if you: Think you are having a reaction to a medicine you are taking. Have headaches that keep coming back (recurring). Feel dizzy. Have swelling in your ankles. Have trouble with your vision. Get help right away if you: Develop a severe headache or confusion. Have unusual weakness or numbness. Feel faint. Have severe pain in your chest or abdomen. Vomit repeatedly. Have trouble breathing. These symptoms may be an emergency. Get help right away. Call 911. Do not wait to see if the symptoms will go away. Do not drive yourself to the hospital. Summary Hypertension is when the force of blood pumping through your arteries is too strong. If this condition is not controlled, it may put you at risk for serious complications. Your personal target blood pressure may vary depending on your medical conditions, your age, and other factors. For most people, a normal blood pressure is less than 120/80. Hypertension is treated with lifestyle changes, medicines, or a combination of both. Lifestyle changes include losing weight, eating a healthy,   low-sodium diet, exercising more, and limiting alcohol. This information is not intended to replace advice given to you by your health care provider. Make sure you discuss any questions you have with your health care provider. Document Revised: 11/14/2020 Document Reviewed: 11/14/2020 Elsevier Patient Education  2024 [employer] Inc[employer]  

## 2023-03-25 ENCOUNTER — Other Ambulatory Visit (HOSPITAL_COMMUNITY): Payer: Self-pay

## 2023-04-02 ENCOUNTER — Other Ambulatory Visit (HOSPITAL_COMMUNITY): Payer: Self-pay

## 2023-04-08 ENCOUNTER — Other Ambulatory Visit (HOSPITAL_COMMUNITY): Payer: Self-pay

## 2023-04-08 ENCOUNTER — Telehealth: Admitting: Physician Assistant

## 2023-04-08 DIAGNOSIS — F4310 Post-traumatic stress disorder, unspecified: Secondary | ICD-10-CM

## 2023-04-08 DIAGNOSIS — E7800 Pure hypercholesterolemia, unspecified: Secondary | ICD-10-CM | POA: Diagnosis not present

## 2023-04-08 DIAGNOSIS — I10 Essential (primary) hypertension: Secondary | ICD-10-CM

## 2023-04-08 MED ORDER — METOPROLOL SUCCINATE ER 100 MG PO TB24
100.0000 mg | ORAL_TABLET | Freq: Every day | ORAL | 0 refills | Status: DC
  Filled 2023-04-08: qty 15, 15d supply, fill #0

## 2023-04-08 MED ORDER — TRAZODONE HCL 50 MG PO TABS
50.0000 mg | ORAL_TABLET | Freq: Every day | ORAL | 0 refills | Status: DC
Start: 2023-04-08 — End: 2023-05-16
  Filled 2023-04-08: qty 15, 15d supply, fill #0

## 2023-04-08 MED ORDER — ROSUVASTATIN CALCIUM 10 MG PO TABS
10.0000 mg | ORAL_TABLET | Freq: Every day | ORAL | 0 refills | Status: DC
  Filled 2023-04-08: qty 15, 15d supply, fill #0

## 2023-04-08 MED ORDER — CHLORTHALIDONE 25 MG PO TABS
25.0000 mg | ORAL_TABLET | Freq: Every day | ORAL | 0 refills | Status: DC
  Filled 2023-04-08: qty 15, 15d supply, fill #0

## 2023-04-08 NOTE — Progress Notes (Signed)
 Virtual Visit Consent   Sally Rivera, you are scheduled for a virtual visit with a [facility] provider today. Just as with appointments in the office, your consent must be obtained to participate. Your consent will be active for this visit and any virtual visit you may have with one of our providers in the next 365 days. If you have a MyChart account, a copy of this consent can be sent to you electronically.  As this is a virtual visit, video technology does not allow for your provider to perform a traditional examination. This may limit your provider's ability to fully assess your condition. If your provider identifies any concerns that need to be evaluated in person or the need to arrange testing (such as labs, EKG, etc.), we will make arrangements to do so. Although advances in technology are sophisticated, we cannot ensure that it will always work on either your end or our end. If the connection with a video visit is poor, the visit may have to be switched to a telephone visit. With either a video or telephone visit, we are not always able to ensure that we have a secure connection.  By engaging in this virtual visit, you consent to the provision of healthcare and authorize for your insurance to be billed (if applicable) for the services provided during this visit. Depending on your insurance coverage, you may receive a charge related to this service.  I need to obtain your verbal consent now. Are you willing to proceed with your visit today? Sally Rivera has provided verbal consent on 04/08/2023 for a virtual visit (video or telephone). Sally Loveless, PA-C  Date: 04/08/2023 4:20 PM   Virtual Visit via Video Note   IMargaretann Rivera, connected with  Sally Rivera  ([id number], 1970/11/18) on 04/08/23 at  4:15 PM EDT by a video-enabled telemedicine application and verified that I am speaking with the correct person using two identifiers.  Location: Patient: Virtual Visit Location  Patient: Other: work; isolated Provider: Engineer, mining Provider: Home Office   I discussed the limitations of evaluation and management by telemedicine and the availability of in person appointments. The patient expressed understanding and agreed to proceed.    History of Present Illness: Sally Rivera is a 53 y.o. who identifies as a female who was assigned female at birth, and is being seen today for medication refills. Last seen on Dec 20,2024 for medication refills. She reports she has been having a dull, daily headache and feels it is from not having her Chlorthalidone and Trazodone. She does also take Metoprolol for her BP and reports that is also getting low. She is also in need of her Rosuvastatin.  HPI: HPI  Problems:  Patient Active Problem List   Diagnosis Date Noted   Encounter for medical examination to establish care 12/18/2020   Mixed hyperlipidemia 12/18/2020   Hypokalemia 12/18/2020   Mass of right breast 12/18/2020   Post-traumatic stress reaction 12/18/2020   Obesity, Class II, BMI 35-39.9 10/15/2016   HTN (hypertension) 04/18/2016    Allergies:  Allergies  Allergen Reactions   Lisinopril Swelling   Soma [Carisoprodol] Swelling   Amlodipine Other (See Comments)    Muscle pain   Medications:  Current Outpatient Medications:    Blood Pressure Monitoring (B-D ASSURE BPM/MANUAL ARM CUFF) MISC, 1 Device by Does not apply route daily. (Patient not taking: Reported on 12/18/2020), Disp: 1 each, Rfl: 0   chlorthalidone (HYGROTON) 25 MG tablet, Take 1 tablet (25 mg  total) by mouth daily., Disp: 30 tablet, Rfl: 0   chlorthalidone (HYGROTON) 25 MG tablet, TAKE 1 TABLET(25 MG) BY MOUTH DAILY, Disp: 15 tablet, Rfl: 0   escitalopram (LEXAPRO) 10 MG tablet, Take 1/2 tab (5mg ) by mouth at bedtime for 4 days then increase to 1 tab (10mg ) by mouth at bedtime., Disp: 30 tablet, Rfl: 3   metoprolol succinate (TOPROL-XL) 100 MG 24 hr tablet, Take 1 tablet (100 mg total) by  mouth daily. Take with or immediately following a meal., Disp: 15 tablet, Rfl: 0   potassium chloride SA (KLOR-CON) 20 MEQ tablet, Take 1 tablet (20 mEq total) by mouth daily., Disp: 30 tablet, Rfl: 3   rosuvastatin (CRESTOR) 10 MG tablet, TAKE 1 TABLET(10 MG) BY MOUTH DAILY, Disp: 90 tablet, Rfl: 3   rosuvastatin (CRESTOR) 10 MG tablet, Take 1 tablet (10 mg total) by mouth daily., Disp: 15 tablet, Rfl: 0   SUMAtriptan (IMITREX) 25 MG tablet, Take 1 tablet (25 mg total) by mouth every 2 (two) hours as needed for migraine. May repeat in 2 hours if headache persists or recurs., Disp: 10 tablet, Rfl: 3   traZODone (DESYREL) 50 MG tablet, Take 1 tablet (50 mg total) by mouth at bedtime., Disp: 15 tablet, Rfl: 0  Observations/Objective: Patient is well-developed, well-nourished in no acute distress.  Resting comfortably Head is normocephalic, atraumatic.  No labored breathing.  Speech is clear and coherent with logical content.  Patient is alert and oriented at baseline.    Assessment and Plan: 1. Essential hypertension - chlorthalidone (HYGROTON) 25 MG tablet; TAKE 1 TABLET(25 MG) BY MOUTH DAILY  Dispense: 15 tablet; Refill: 0 - metoprolol succinate (TOPROL-XL) 100 MG 24 hr tablet; Take 1 tablet (100 mg total) by mouth daily. Take with or immediately following a meal.  Dispense: 15 tablet; Refill: 0  2. Post-traumatic stress reaction - traZODone (DESYREL) 50 MG tablet; Take 1 tablet (50 mg total) by mouth at bedtime.  Dispense: 15 tablet; Refill: 0  3. Pure hypercholesterolemia (Primary) - rosuvastatin (CRESTOR) 10 MG tablet; Take 1 tablet (10 mg total) by mouth daily.  Dispense: 15 tablet; Refill: 0  - Medications refilled for 15 days since patient has already obtained a refill from the Virtual Urgent Care team - Advised to follow up with PCP, found patient's previous PCP and was able to provide phone number to the new office she is located at for her to re-establish   Follow Up  Instructions: I discussed the assessment and treatment plan with the patient. The patient was provided an opportunity to ask questions and all were answered. The patient agreed with the plan and demonstrated an understanding of the instructions.  A copy of instructions were sent to the patient via MyChart unless otherwise noted below.    The patient was advised to call back or seek an in-person evaluation if the symptoms worsen or if the condition fails to improve as anticipated.    Sally Loveless, PA-C

## 2023-04-08 NOTE — Patient Instructions (Signed)
 Conner Ferger, thank you for joining [name], PA-C for today's virtual visit.  While this provider is not your primary care provider (PCP), if your PCP is located in our provider database this encounter information will be shared with them immediately following your visit.   A [facility] MyChart account gives you access to today's visit and all your visits, tests, and labs performed at [facility] " click here if you don't have a [facility] MyChart account or go to [email or web address]  Consent: (Patient) Sally Rivera provided verbal consent for this virtual visit at the beginning of the encounter.  Current Medications:  Current Outpatient Medications:    Blood Pressure Monitoring (B-D ASSURE BPM/MANUAL ARM CUFF) MISC, 1 Device by Does not apply route daily. (Patient not taking: Reported on 12/18/2020), Disp: 1 each, Rfl: 0   chlorthalidone (HYGROTON) 25 MG tablet, Take 1 tablet (25 mg total) by mouth daily., Disp: 30 tablet, Rfl: 0   chlorthalidone (HYGROTON) 25 MG tablet, Take 1 tablet (25 mg total) by mouth daily., Disp: 15 tablet, Rfl: 0   escitalopram (LEXAPRO) 10 MG tablet, Take 1/2 tab (5mg ) by mouth at bedtime for 4 days then increase to 1 tab (10mg ) by mouth at bedtime., Disp: 30 tablet, Rfl: 3   metoprolol succinate (TOPROL-XL) 100 MG 24 hr tablet, Take 1 tablet (100 mg total) by mouth daily. Take with or immediately following a meal., Disp: 15 tablet, Rfl: 0   potassium chloride SA (KLOR-CON) 20 MEQ tablet, Take 1 tablet (20 mEq total) by mouth daily., Disp: 30 tablet, Rfl: 3   rosuvastatin (CRESTOR) 10 MG tablet, TAKE 1 TABLET(10 MG) BY MOUTH DAILY, Disp: 90 tablet, Rfl: 3   rosuvastatin (CRESTOR) 10 MG tablet, Take 1 tablet (10 mg total) by mouth daily., Disp: 15 tablet, Rfl: 0   SUMAtriptan (IMITREX) 25 MG tablet, Take 1 tablet (25 mg total) by mouth every 2 (two) hours as needed for migraine. May repeat in 2 hours if headache persists or recurs.,  Disp: 10 tablet, Rfl: 3   traZODone (DESYREL) 50 MG tablet, Take 1 tablet (50 mg total) by mouth at bedtime., Disp: 15 tablet, Rfl: 0   Medications ordered in this encounter:  Meds ordered this encounter  Medications   chlorthalidone (HYGROTON) 25 MG tablet    Sig: Take 1 tablet (25 mg total) by mouth daily.    Dispense:  15 tablet    Refill:  0    Supervising Provider:   Merrilee Jansky [[id number]]   metoprolol succinate (TOPROL-XL) 100 MG 24 hr tablet    Sig: Take 1 tablet (100 mg total) by mouth daily. Take with or immediately following a meal.    Dispense:  15 tablet    Refill:  0    Supervising Provider:   Merrilee Jansky [[id number]]   rosuvastatin (CRESTOR) 10 MG tablet    Sig: Take 1 tablet (10 mg total) by mouth daily.    Dispense:  15 tablet    Refill:  0    Supervising Provider:   Merrilee Jansky [[id number]]   traZODone (DESYREL) 50 MG tablet    Sig: Take 1 tablet (50 mg total) by mouth at bedtime.    Dispense:  15 tablet    Refill:  0    Supervising Provider:   Merrilee Jansky [phone]     *If you need refills on other medications prior to your next appointment, please contact your pharmacy*  Follow-Up: Call back or seek an  in-person evaluation if the symptoms worsen or if the condition fails to improve as anticipated.  [facility] [phone]  Other Instructions Follow up with PCP for any further medication refills   If you have been instructed to have an in-person evaluation today at a local Urgent Care facility, please use the link below. It will take you to a list of all of our available [facility], including address, phone number and hours of operation. Please do not delay care.  [facility]  If you or a family member do not have a primary care provider, use the link below to schedule a visit and establish care. When you choose a [facility] primary care physician or advanced practice provider, you gain a  long-term partner in health. Find a Primary Care Provider  Learn more about [facility] in-office and virtual care options:  - Get Care Now

## 2023-04-15 ENCOUNTER — Other Ambulatory Visit (HOSPITAL_COMMUNITY): Payer: Self-pay

## 2023-05-04 ENCOUNTER — Other Ambulatory Visit (HOSPITAL_COMMUNITY): Payer: Self-pay

## 2023-05-05 ENCOUNTER — Other Ambulatory Visit (HOSPITAL_COMMUNITY): Payer: Self-pay

## 2023-05-05 ENCOUNTER — Encounter: Payer: Self-pay | Admitting: Family Medicine

## 2023-05-13 ENCOUNTER — Other Ambulatory Visit (HOSPITAL_COMMUNITY): Payer: Self-pay

## 2023-05-16 ENCOUNTER — Encounter: Payer: Self-pay | Admitting: Family Medicine

## 2023-05-16 ENCOUNTER — Other Ambulatory Visit (HOSPITAL_COMMUNITY): Payer: Self-pay

## 2023-05-16 ENCOUNTER — Ambulatory Visit (INDEPENDENT_AMBULATORY_CARE_PROVIDER_SITE_OTHER): Admitting: Family Medicine

## 2023-05-16 VITALS — BP 138/86 | HR 74 | Temp 97.5°F | Ht 63.0 in | Wt 185.6 lb

## 2023-05-16 DIAGNOSIS — F4310 Post-traumatic stress disorder, unspecified: Secondary | ICD-10-CM

## 2023-05-16 DIAGNOSIS — N6310 Unspecified lump in the right breast, unspecified quadrant: Secondary | ICD-10-CM | POA: Diagnosis not present

## 2023-05-16 DIAGNOSIS — F5105 Insomnia due to other mental disorder: Secondary | ICD-10-CM

## 2023-05-16 DIAGNOSIS — R21 Rash and other nonspecific skin eruption: Secondary | ICD-10-CM

## 2023-05-16 DIAGNOSIS — E876 Hypokalemia: Secondary | ICD-10-CM | POA: Diagnosis not present

## 2023-05-16 DIAGNOSIS — F99 Mental disorder, not otherwise specified: Secondary | ICD-10-CM

## 2023-05-16 DIAGNOSIS — I10 Essential (primary) hypertension: Secondary | ICD-10-CM

## 2023-05-16 DIAGNOSIS — Z124 Encounter for screening for malignant neoplasm of cervix: Secondary | ICD-10-CM

## 2023-05-16 DIAGNOSIS — E782 Mixed hyperlipidemia: Secondary | ICD-10-CM | POA: Diagnosis not present

## 2023-05-16 DIAGNOSIS — G43909 Migraine, unspecified, not intractable, without status migrainosus: Secondary | ICD-10-CM | POA: Diagnosis not present

## 2023-05-16 MED ORDER — ROSUVASTATIN CALCIUM 10 MG PO TABS
10.0000 mg | ORAL_TABLET | Freq: Every day | ORAL | 3 refills | Status: AC
  Filled 2023-05-16 – 2023-08-08 (×2): qty 90, 90d supply, fill #0
  Filled 2023-11-07: qty 90, 90d supply, fill #1
  Filled 2024-02-12: qty 90, 90d supply, fill #2

## 2023-05-16 MED ORDER — POTASSIUM CHLORIDE CRYS ER 20 MEQ PO TBCR
20.0000 meq | EXTENDED_RELEASE_TABLET | Freq: Every day | ORAL | 3 refills | Status: AC
  Filled 2023-05-16: qty 90, 90d supply, fill #0

## 2023-05-16 MED ORDER — HYDROXYZINE HCL 50 MG PO TABS
25.0000 mg | ORAL_TABLET | Freq: Three times a day (TID) | ORAL | 0 refills | Status: AC | PRN
  Filled 2023-05-16: qty 90, 30d supply, fill #0

## 2023-05-16 MED ORDER — CHLORTHALIDONE 25 MG PO TABS
25.0000 mg | ORAL_TABLET | Freq: Every day | ORAL | 3 refills | Status: AC
  Filled 2023-05-16 – 2023-08-08 (×2): qty 90, 90d supply, fill #0
  Filled 2023-11-07: qty 90, 90d supply, fill #1
  Filled 2024-02-12: qty 90, 90d supply, fill #2

## 2023-05-16 MED ORDER — KETOCONAZOLE 2 % EX SHAM
1.0000 | MEDICATED_SHAMPOO | Freq: Every day | CUTANEOUS | 0 refills | Status: DC
  Filled 2023-05-16: qty 120, 24d supply, fill #0

## 2023-05-16 MED ORDER — TRAZODONE HCL 50 MG PO TABS
50.0000 mg | ORAL_TABLET | Freq: Every evening | ORAL | 3 refills | Status: AC | PRN
  Filled 2023-05-16: qty 90, 90d supply, fill #0

## 2023-05-16 MED ORDER — METOPROLOL SUCCINATE ER 100 MG PO TB24
100.0000 mg | ORAL_TABLET | Freq: Every day | ORAL | 3 refills | Status: AC
  Filled 2023-05-16 – 2023-08-08 (×2): qty 90, 90d supply, fill #0
  Filled 2023-11-27: qty 90, 90d supply, fill #1
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
## 2023-05-19 ENCOUNTER — Other Ambulatory Visit: Payer: Self-pay | Admitting: Family Medicine

## 2023-05-19 DIAGNOSIS — N6310 Unspecified lump in the right breast, unspecified quadrant: Secondary | ICD-10-CM

## 2023-05-22 ENCOUNTER — Other Ambulatory Visit (HOSPITAL_COMMUNITY): Payer: Self-pay

## 2023-05-22 IMAGING — MG MM DIGITAL DIAGNOSTIC UNILAT*R* W/ TOMO W/ CAD
4 series · 4 of 12 positions shown · non-contrast
Comparison: Previous exam(s).

CLINICAL DATA: 50-year-old female for further evaluation of 2
possible RIGHT breast masses on screening mammogram.

EXAM:
DIGITAL DIAGNOSTIC UNILATERAL RIGHT MAMMOGRAM WITH TOMOSYNTHESIS AND
CAD; ULTRASOUND RIGHT BREAST LIMITED
TECHNIQUE: Right digital diagnostic mammography and breast tomosynthesis was
performed. The images were evaluated with computer-aided detection.;
Targeted ultrasound examination of the right breast was performed

[R MLO synth-2D]
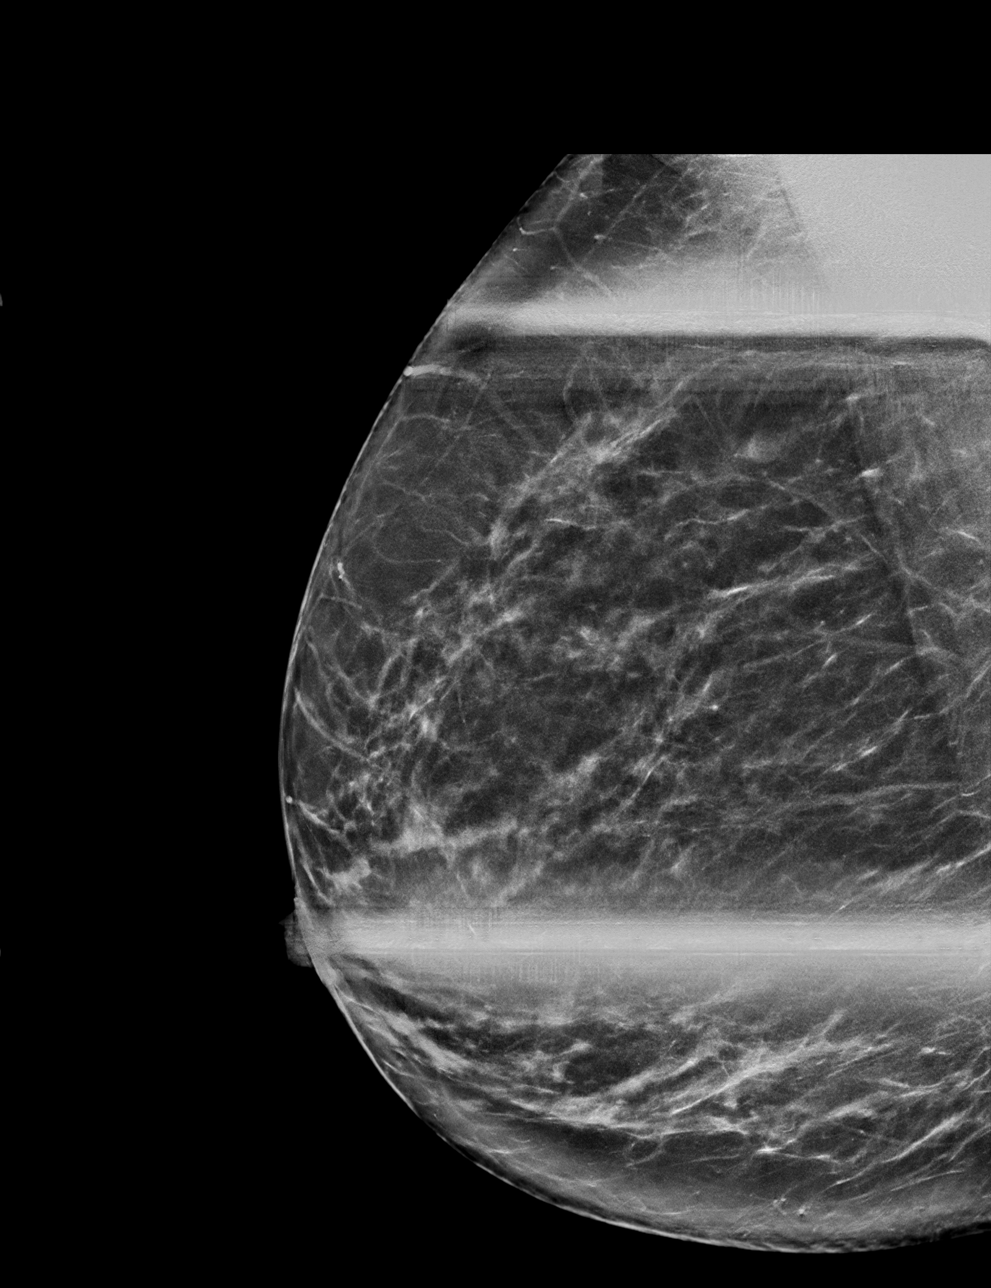

[R CC synth-2D]
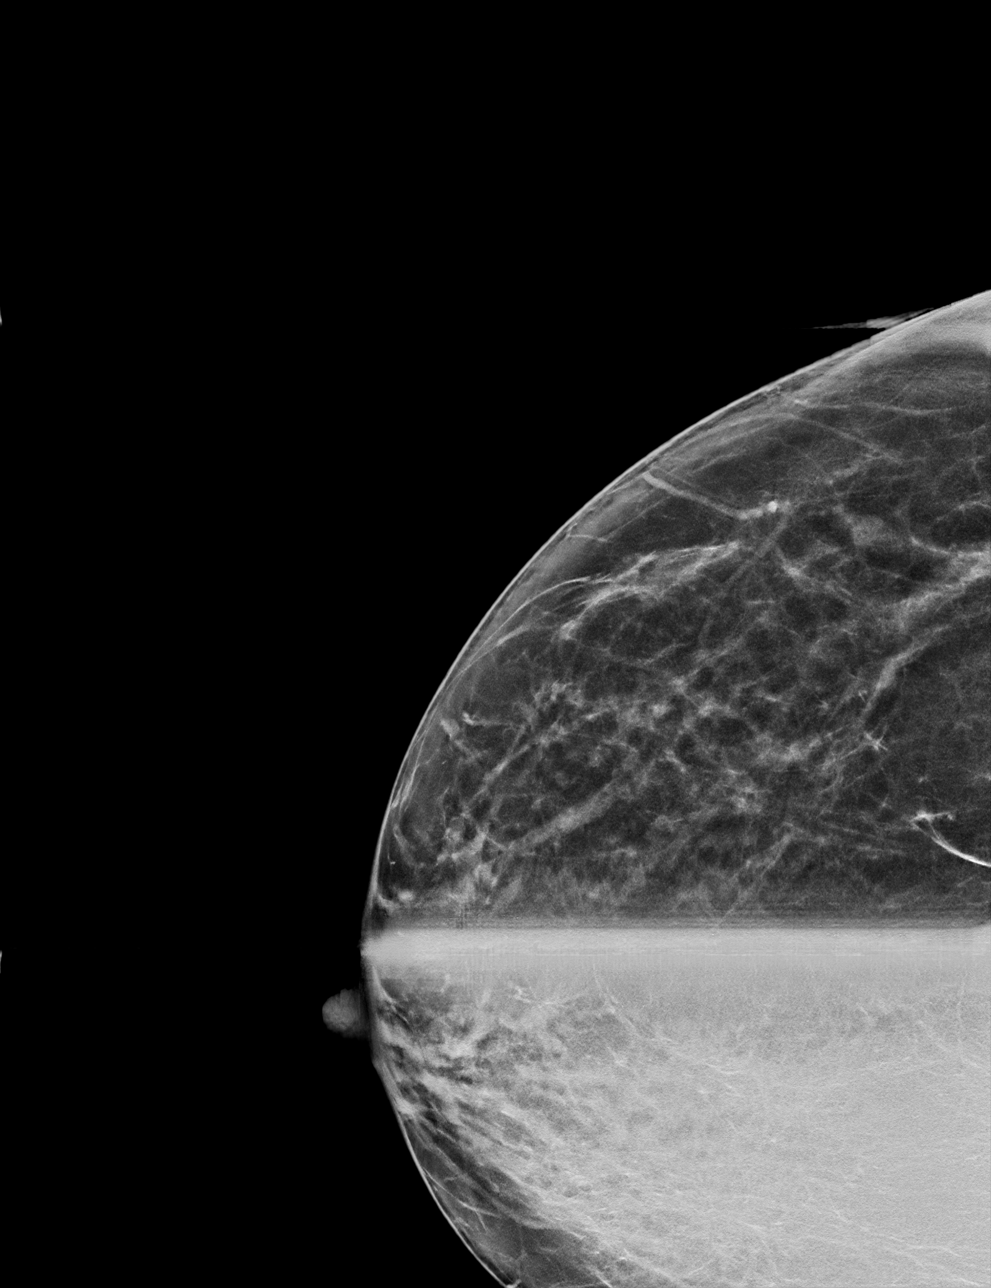

[R CC tomo · tomo slice 31/60.0]
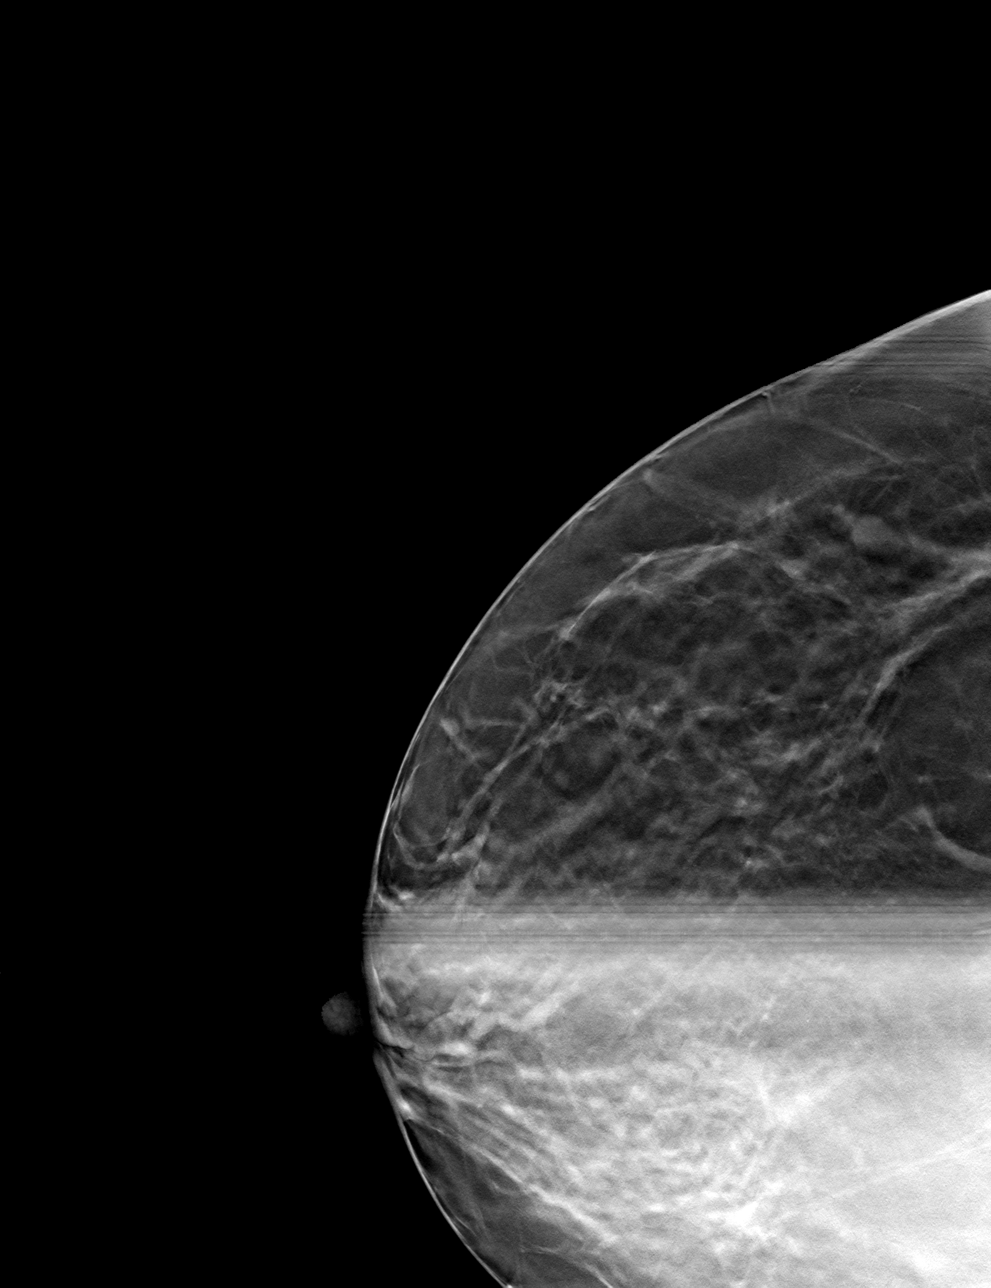

[R MLO tomo · tomo slice 35/69.0]
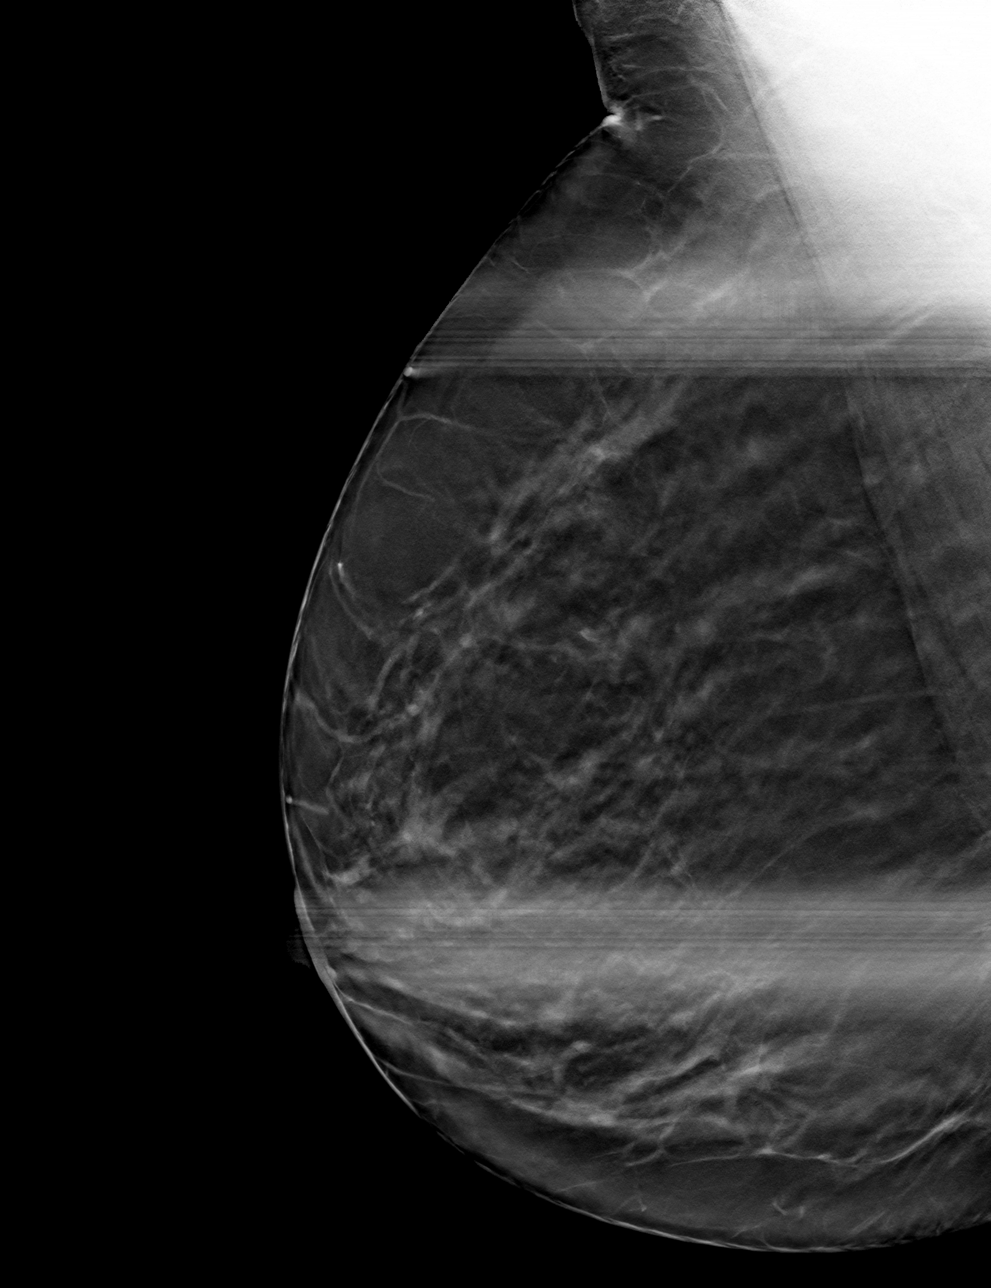

[4 of 12 positions shown; findings below may reference images not displayed]

ACR Breast Density Category b: There are scattered areas of
fibroglandular density.
FINDINGS: 2D/3D spot compression views of the RIGHT breast demonstrate a
persistent circumscribed oval mass within the posterior UPPER-OUTER
RIGHT breast and a posterior circumscribed oval mass within the
anterior OUTER RIGHT breast.

Targeted ultrasound is performed, showing a 0.7 x 0.3 x 0.7 cm
circumscribed oval cystic mass at the 10 o'clock position of the
RIGHT breast 8 cm from the nipple with mild anterior peripheral
hypodensity, but without internal vascular flow and corresponds to
the screening study finding.

A 0.3 x 0.2 x 0.3 cm partially circumscribed partially indistinct
hypoechoic mass at the 9 o'clock position of the RIGHT breast 2 cm
from the nipple is noted, corresponding to the screening study
finding.

Other very small benign complicated cysts within the OUTER RIGHT
breast are noted.

No abnormal RIGHT axillary lymph nodes are noted.
IMPRESSION: 1. Indeterminate 0.3 cm OUTER RIGHT breast mass at the 9 o'clock
position 2 cm from the nipple. As this may represent a complicated
cyst, attempt at aspiration is 1st recommended and if contents
cannot be completely aspirated or this is felt to be solid, then
tissue sampling is recommended.
2. Likely benign 0.7 cm UPPER-OUTER RIGHT breast cystic mass at the
10 o'clock position. After discussion with the patient, she desires
aspiration/tissue sampling.
3. No abnormal appearing RIGHT axillary lymph nodes.

RECOMMENDATION:
Ultrasound-guided aspiration/biopsies of both the 0.3 cm OUTER RIGHT
breast mass and 0.7 cm UPPER-OUTER RIGHT breast mass. These
procedures will be scheduled.

I have discussed the findings and recommendations with the patient.
If applicable, a reminder letter will be sent to the patient
regarding the next appointment.

BI-RADS CATEGORY  4: Suspicious.

## 2023-06-04 ENCOUNTER — Encounter

## 2023-06-04 DIAGNOSIS — Z1231 Encounter for screening mammogram for malignant neoplasm of breast: Secondary | ICD-10-CM

## 2023-06-24 ENCOUNTER — Encounter: Admitting: Family Medicine

## 2023-08-08 ENCOUNTER — Other Ambulatory Visit (HOSPITAL_BASED_OUTPATIENT_CLINIC_OR_DEPARTMENT_OTHER): Payer: Self-pay

## 2023-08-08 ENCOUNTER — Other Ambulatory Visit: Payer: Self-pay

## 2023-08-08 ENCOUNTER — Other Ambulatory Visit (HOSPITAL_COMMUNITY): Payer: Self-pay

## 2023-08-19 ENCOUNTER — Encounter: Admitting: Family Medicine

## 2023-08-28 ENCOUNTER — Other Ambulatory Visit (HOSPITAL_COMMUNITY): Payer: Self-pay

## 2023-08-28 MED ORDER — AMOXICILLIN 500 MG PO CAPS
500.0000 mg | ORAL_CAPSULE | Freq: Four times a day (QID) | ORAL | 0 refills | Status: AC
  Filled 2023-08-28: qty 28, 7d supply, fill #0

## 2023-08-28 MED ORDER — IBUPROFEN 800 MG PO TABS
800.0000 mg | ORAL_TABLET | Freq: Three times a day (TID) | ORAL | 0 refills | Status: AC
  Filled 2023-08-28: qty 15, 5d supply, fill #0

## 2023-09-26 ENCOUNTER — Encounter: Admitting: Family Medicine

## 2023-10-07 ENCOUNTER — Other Ambulatory Visit (HOSPITAL_BASED_OUTPATIENT_CLINIC_OR_DEPARTMENT_OTHER): Payer: Self-pay

## 2023-10-07 MED ORDER — FLUZONE 0.5 ML IM SUSY
0.5000 mL | PREFILLED_SYRINGE | Freq: Once | INTRAMUSCULAR | 0 refills | Status: AC
  Filled 2023-10-07: qty 0.5, 1d supply, fill #0

## 2023-10-15 ENCOUNTER — Other Ambulatory Visit (HOSPITAL_COMMUNITY): Payer: Self-pay

## 2023-11-07 ENCOUNTER — Other Ambulatory Visit: Payer: Self-pay

## 2023-11-07 ENCOUNTER — Other Ambulatory Visit (HOSPITAL_COMMUNITY): Payer: Self-pay

## 2023-11-10 ENCOUNTER — Other Ambulatory Visit: Payer: Self-pay

## 2023-12-23 ENCOUNTER — Other Ambulatory Visit: Payer: Self-pay | Admitting: Family Medicine

## 2023-12-23 DIAGNOSIS — R21 Rash and other nonspecific skin eruption: Secondary | ICD-10-CM

## 2023-12-25 ENCOUNTER — Other Ambulatory Visit (HOSPITAL_COMMUNITY): Payer: Self-pay

## 2023-12-25 MED ORDER — KETOCONAZOLE 2 % EX SHAM
1.0000 | MEDICATED_SHAMPOO | Freq: Every day | CUTANEOUS | 0 refills | Status: AC
  Filled 2023-12-25: qty 120, 30d supply, fill #0
  Filled 2023-12-29: qty 120, 7d supply, fill #0

## 2023-12-29 ENCOUNTER — Other Ambulatory Visit (HOSPITAL_COMMUNITY): Payer: Self-pay

## 2023-12-30 ENCOUNTER — Other Ambulatory Visit: Payer: Self-pay

## 2023-12-31 ENCOUNTER — Other Ambulatory Visit: Payer: Self-pay

## 2023-12-31 ENCOUNTER — Encounter: Payer: Self-pay | Admitting: Pharmacist

## 2024-01-21 ENCOUNTER — Other Ambulatory Visit (HOSPITAL_COMMUNITY): Payer: Self-pay

## 2024-02-12 ENCOUNTER — Other Ambulatory Visit (HOSPITAL_COMMUNITY): Payer: Self-pay

## 2024-02-12 ENCOUNTER — Other Ambulatory Visit: Payer: Self-pay

## 2024-02-13 ENCOUNTER — Other Ambulatory Visit: Payer: Self-pay

## 2024-04-06 ENCOUNTER — Encounter: Payer: Self-pay | Admitting: Advanced Practice Midwife
